# Patient Record
Sex: Female | Born: 1935
Health system: Southern US, Community
[De-identification: ages and names within clinical notes are randomized; demographics above are authoritative.]

## PROBLEM LIST (undated history)

## (undated) DIAGNOSIS — J449 Chronic obstructive pulmonary disease, unspecified: Secondary | ICD-10-CM

## (undated) DIAGNOSIS — F0390 Unspecified dementia without behavioral disturbance: Secondary | ICD-10-CM

## (undated) DIAGNOSIS — I10 Essential (primary) hypertension: Secondary | ICD-10-CM

---

## 2008-03-14 ENCOUNTER — Emergency Department (HOSPITAL_COMMUNITY): Admission: EM | Admit: 2008-03-14 | Discharge: 2008-03-14 | Payer: Self-pay | Admitting: Emergency Medicine

## 2009-09-06 ENCOUNTER — Inpatient Hospital Stay (HOSPITAL_COMMUNITY): Admission: RE | Admit: 2009-09-06 | Discharge: 2009-09-08 | Payer: Self-pay | Admitting: Orthopedic Surgery

## 2009-09-21 ENCOUNTER — Ambulatory Visit: Payer: Self-pay | Admitting: Vascular Surgery

## 2010-10-05 LAB — BASIC METABOLIC PANEL
BUN: 12 mg/dL (ref 6–23)
CO2: 28 mEq/L (ref 19–32)
CO2: 29 mEq/L (ref 19–32)
Calcium: 8.3 mg/dL — ABNORMAL LOW (ref 8.4–10.5)
Chloride: 108 mEq/L (ref 96–112)
Creatinine, Ser: 0.6 mg/dL (ref 0.4–1.2)
Creatinine, Ser: 0.74 mg/dL (ref 0.4–1.2)
Glucose, Bld: 103 mg/dL — ABNORMAL HIGH (ref 70–99)

## 2010-10-05 LAB — TYPE AND SCREEN: ABO/RH(D): A POS

## 2010-10-05 LAB — CBC
MCHC: 34.5 g/dL (ref 30.0–36.0)
MCHC: 34.7 g/dL (ref 30.0–36.0)
MCV: 93.5 fL (ref 78.0–100.0)
Platelets: 164 10*3/uL (ref 150–400)
RDW: 12.4 % (ref 11.5–15.5)
RDW: 12.9 % (ref 11.5–15.5)

## 2010-10-05 LAB — PROTIME-INR
INR: 1.28 (ref 0.00–1.49)
Prothrombin Time: 12.3 seconds (ref 11.6–15.2)
Prothrombin Time: 15.9 seconds — ABNORMAL HIGH (ref 11.6–15.2)

## 2010-10-05 LAB — URINALYSIS, ROUTINE W REFLEX MICROSCOPIC
Glucose, UA: NEGATIVE mg/dL
Ketones, ur: NEGATIVE mg/dL
pH: 6 (ref 5.0–8.0)

## 2010-10-05 LAB — ABO/RH: ABO/RH(D): A POS

## 2010-10-05 LAB — APTT: aPTT: 29 seconds (ref 24–37)

## 2010-10-05 LAB — GLUCOSE, CAPILLARY: Glucose-Capillary: 124 mg/dL — ABNORMAL HIGH (ref 70–99)

## 2010-11-28 NOTE — Procedures (Signed)
DUPLEX DEEP VENOUS EXAM - LOWER EXTREMITY   INDICATION:  Right lower extremity swelling and pain.   HISTORY:  Edema:  Yes, started 3 days prior.  Trauma/Surgery:  Two weeks post right hip replacement.  Pain:  Yes.  PE:  No.  Previous DVT:  No.  Anticoagulants:  No.  Other:   DUPLEX EXAM:                CFV   SFV   PopV  PTV    GSV                R  L  R  L  R  L  R   L  R  L  Thrombosis    o  o  o     o     o      o  Spontaneous   +  +  +     +     +      +  Phasic        +  +  +     +     +      +  Augmentation  +  +  +     +     +      +  Compressible  +  +  +     +     +      +  Competent     +  +  +     +     +      +   Legend:  + - yes  o - no  p - partial  D - decreased   IMPRESSION:  No evidence of deep venous thrombosis identified.  Popliteal is difficult to visualize due to swelling, but complete color  filling is noted.  Fluid retention is also noted in the calf.    _____________________________  Janetta Hora. Fields, MD   CJ/MEDQ  D:  09/21/2009  T:  09/21/2009  Job:  [id number]

## 2012-05-06 ENCOUNTER — Encounter: Payer: Self-pay | Admitting: Internal Medicine

## 2012-05-06 DIAGNOSIS — F0390 Unspecified dementia without behavioral disturbance: Secondary | ICD-10-CM

## 2012-05-06 DIAGNOSIS — Z8673 Personal history of transient ischemic attack (TIA), and cerebral infarction without residual deficits: Secondary | ICD-10-CM

## 2013-05-06 DIAGNOSIS — R7989 Other specified abnormal findings of blood chemistry: Secondary | ICD-10-CM

## 2013-05-06 DIAGNOSIS — R21 Rash and other nonspecific skin eruption: Secondary | ICD-10-CM

## 2015-07-30 DIAGNOSIS — K5900 Constipation, unspecified: Secondary | ICD-10-CM | POA: Diagnosis not present

## 2015-07-30 DIAGNOSIS — Z8719 Personal history of other diseases of the digestive system: Secondary | ICD-10-CM | POA: Diagnosis not present

## 2015-08-12 DIAGNOSIS — G309 Alzheimer's disease, unspecified: Secondary | ICD-10-CM | POA: Diagnosis not present

## 2015-08-12 DIAGNOSIS — Z789 Other specified health status: Secondary | ICD-10-CM | POA: Diagnosis not present

## 2015-08-12 DIAGNOSIS — I10 Essential (primary) hypertension: Secondary | ICD-10-CM | POA: Diagnosis not present

## 2015-08-12 DIAGNOSIS — Z6822 Body mass index (BMI) 22.0-22.9, adult: Secondary | ICD-10-CM | POA: Diagnosis not present

## 2015-08-25 DIAGNOSIS — M5481 Occipital neuralgia: Secondary | ICD-10-CM | POA: Diagnosis not present

## 2015-08-30 DIAGNOSIS — I7389 Other specified peripheral vascular diseases: Secondary | ICD-10-CM | POA: Diagnosis not present

## 2015-08-30 DIAGNOSIS — R51 Headache: Secondary | ICD-10-CM | POA: Diagnosis not present

## 2015-08-30 DIAGNOSIS — Z87898 Personal history of other specified conditions: Secondary | ICD-10-CM | POA: Diagnosis not present

## 2015-09-13 DIAGNOSIS — G309 Alzheimer's disease, unspecified: Secondary | ICD-10-CM | POA: Diagnosis not present

## 2015-09-13 DIAGNOSIS — E7800 Pure hypercholesterolemia, unspecified: Secondary | ICD-10-CM | POA: Diagnosis not present

## 2015-09-13 DIAGNOSIS — K219 Gastro-esophageal reflux disease without esophagitis: Secondary | ICD-10-CM | POA: Diagnosis not present

## 2015-11-09 DIAGNOSIS — J449 Chronic obstructive pulmonary disease, unspecified: Secondary | ICD-10-CM | POA: Diagnosis not present

## 2015-11-11 DIAGNOSIS — R5383 Other fatigue: Secondary | ICD-10-CM | POA: Diagnosis not present

## 2015-11-11 DIAGNOSIS — Z1389 Encounter for screening for other disorder: Secondary | ICD-10-CM | POA: Diagnosis not present

## 2015-11-11 DIAGNOSIS — Z299 Encounter for prophylactic measures, unspecified: Secondary | ICD-10-CM | POA: Diagnosis not present

## 2015-11-11 DIAGNOSIS — Z6823 Body mass index (BMI) 23.0-23.9, adult: Secondary | ICD-10-CM | POA: Diagnosis not present

## 2015-11-11 DIAGNOSIS — Z1211 Encounter for screening for malignant neoplasm of colon: Secondary | ICD-10-CM | POA: Diagnosis not present

## 2015-11-11 DIAGNOSIS — Z0000 Encounter for general adult medical examination without abnormal findings: Secondary | ICD-10-CM | POA: Diagnosis not present

## 2015-11-11 DIAGNOSIS — Z7189 Other specified counseling: Secondary | ICD-10-CM | POA: Diagnosis not present

## 2015-11-11 DIAGNOSIS — E559 Vitamin D deficiency, unspecified: Secondary | ICD-10-CM | POA: Diagnosis not present

## 2015-11-11 DIAGNOSIS — E7800 Pure hypercholesterolemia, unspecified: Secondary | ICD-10-CM | POA: Diagnosis not present

## 2015-11-25 DIAGNOSIS — E7800 Pure hypercholesterolemia, unspecified: Secondary | ICD-10-CM | POA: Diagnosis not present

## 2015-11-25 DIAGNOSIS — I10 Essential (primary) hypertension: Secondary | ICD-10-CM | POA: Diagnosis not present

## 2015-11-25 DIAGNOSIS — G309 Alzheimer's disease, unspecified: Secondary | ICD-10-CM | POA: Diagnosis not present

## 2015-12-02 DIAGNOSIS — L03116 Cellulitis of left lower limb: Secondary | ICD-10-CM | POA: Diagnosis not present

## 2015-12-09 DIAGNOSIS — Z299 Encounter for prophylactic measures, unspecified: Secondary | ICD-10-CM | POA: Diagnosis not present

## 2015-12-09 DIAGNOSIS — M7061 Trochanteric bursitis, right hip: Secondary | ICD-10-CM | POA: Diagnosis not present

## 2015-12-09 DIAGNOSIS — L03116 Cellulitis of left lower limb: Secondary | ICD-10-CM | POA: Diagnosis not present

## 2015-12-09 DIAGNOSIS — R609 Edema, unspecified: Secondary | ICD-10-CM | POA: Diagnosis not present

## 2016-01-09 DIAGNOSIS — L570 Actinic keratosis: Secondary | ICD-10-CM | POA: Diagnosis not present

## 2016-01-09 DIAGNOSIS — L219 Seborrheic dermatitis, unspecified: Secondary | ICD-10-CM | POA: Diagnosis not present

## 2016-01-12 DIAGNOSIS — H6123 Impacted cerumen, bilateral: Secondary | ICD-10-CM | POA: Diagnosis not present

## 2016-02-01 DIAGNOSIS — M2010 Hallux valgus (acquired), unspecified foot: Secondary | ICD-10-CM | POA: Diagnosis not present

## 2016-02-01 DIAGNOSIS — L600 Ingrowing nail: Secondary | ICD-10-CM | POA: Diagnosis not present

## 2016-02-01 DIAGNOSIS — M79673 Pain in unspecified foot: Secondary | ICD-10-CM | POA: Diagnosis not present

## 2016-02-01 DIAGNOSIS — L409 Psoriasis, unspecified: Secondary | ICD-10-CM | POA: Diagnosis not present

## 2016-02-01 DIAGNOSIS — B351 Tinea unguium: Secondary | ICD-10-CM | POA: Diagnosis not present

## 2016-02-07 DIAGNOSIS — I10 Essential (primary) hypertension: Secondary | ICD-10-CM | POA: Diagnosis not present

## 2016-02-07 DIAGNOSIS — G309 Alzheimer's disease, unspecified: Secondary | ICD-10-CM | POA: Diagnosis not present

## 2016-02-07 DIAGNOSIS — E7800 Pure hypercholesterolemia, unspecified: Secondary | ICD-10-CM | POA: Diagnosis not present

## 2016-02-29 DIAGNOSIS — J471 Bronchiectasis with (acute) exacerbation: Secondary | ICD-10-CM | POA: Diagnosis not present

## 2016-02-29 DIAGNOSIS — J441 Chronic obstructive pulmonary disease with (acute) exacerbation: Secondary | ICD-10-CM | POA: Diagnosis not present

## 2016-03-15 DIAGNOSIS — G309 Alzheimer's disease, unspecified: Secondary | ICD-10-CM | POA: Diagnosis not present

## 2016-03-15 DIAGNOSIS — E7800 Pure hypercholesterolemia, unspecified: Secondary | ICD-10-CM | POA: Diagnosis not present

## 2016-03-15 DIAGNOSIS — I10 Essential (primary) hypertension: Secondary | ICD-10-CM | POA: Diagnosis not present

## 2016-05-01 DIAGNOSIS — E7800 Pure hypercholesterolemia, unspecified: Secondary | ICD-10-CM | POA: Diagnosis not present

## 2016-05-01 DIAGNOSIS — G309 Alzheimer's disease, unspecified: Secondary | ICD-10-CM | POA: Diagnosis not present

## 2016-05-01 DIAGNOSIS — I10 Essential (primary) hypertension: Secondary | ICD-10-CM | POA: Diagnosis not present

## 2016-05-09 DIAGNOSIS — Z789 Other specified health status: Secondary | ICD-10-CM | POA: Diagnosis not present

## 2016-05-09 DIAGNOSIS — Z23 Encounter for immunization: Secondary | ICD-10-CM | POA: Diagnosis not present

## 2016-05-14 DIAGNOSIS — Z299 Encounter for prophylactic measures, unspecified: Secondary | ICD-10-CM | POA: Diagnosis not present

## 2016-05-14 DIAGNOSIS — E559 Vitamin D deficiency, unspecified: Secondary | ICD-10-CM | POA: Diagnosis not present

## 2016-05-14 DIAGNOSIS — E7800 Pure hypercholesterolemia, unspecified: Secondary | ICD-10-CM | POA: Diagnosis not present

## 2016-05-14 DIAGNOSIS — I739 Peripheral vascular disease, unspecified: Secondary | ICD-10-CM | POA: Diagnosis not present

## 2016-05-14 DIAGNOSIS — I10 Essential (primary) hypertension: Secondary | ICD-10-CM | POA: Diagnosis not present

## 2016-05-14 DIAGNOSIS — Z6824 Body mass index (BMI) 24.0-24.9, adult: Secondary | ICD-10-CM | POA: Diagnosis not present

## 2016-07-05 DIAGNOSIS — J9610 Chronic respiratory failure, unspecified whether with hypoxia or hypercapnia: Secondary | ICD-10-CM | POA: Diagnosis not present

## 2016-07-05 DIAGNOSIS — J449 Chronic obstructive pulmonary disease, unspecified: Secondary | ICD-10-CM | POA: Diagnosis not present

## 2016-07-25 DIAGNOSIS — G309 Alzheimer's disease, unspecified: Secondary | ICD-10-CM | POA: Diagnosis not present

## 2016-07-25 DIAGNOSIS — E7800 Pure hypercholesterolemia, unspecified: Secondary | ICD-10-CM | POA: Diagnosis not present

## 2016-07-25 DIAGNOSIS — I10 Essential (primary) hypertension: Secondary | ICD-10-CM | POA: Diagnosis not present

## 2016-08-20 DIAGNOSIS — E7800 Pure hypercholesterolemia, unspecified: Secondary | ICD-10-CM | POA: Diagnosis not present

## 2016-08-20 DIAGNOSIS — E559 Vitamin D deficiency, unspecified: Secondary | ICD-10-CM | POA: Diagnosis not present

## 2016-08-20 DIAGNOSIS — Z713 Dietary counseling and surveillance: Secondary | ICD-10-CM | POA: Diagnosis not present

## 2016-08-20 DIAGNOSIS — Z789 Other specified health status: Secondary | ICD-10-CM | POA: Diagnosis not present

## 2016-08-20 DIAGNOSIS — I739 Peripheral vascular disease, unspecified: Secondary | ICD-10-CM | POA: Diagnosis not present

## 2016-08-20 DIAGNOSIS — J449 Chronic obstructive pulmonary disease, unspecified: Secondary | ICD-10-CM | POA: Diagnosis not present

## 2016-08-20 DIAGNOSIS — Z6824 Body mass index (BMI) 24.0-24.9, adult: Secondary | ICD-10-CM | POA: Diagnosis not present

## 2016-08-20 DIAGNOSIS — F0280 Dementia in other diseases classified elsewhere without behavioral disturbance: Secondary | ICD-10-CM | POA: Diagnosis not present

## 2016-08-20 DIAGNOSIS — Z299 Encounter for prophylactic measures, unspecified: Secondary | ICD-10-CM | POA: Diagnosis not present

## 2016-08-20 DIAGNOSIS — G309 Alzheimer's disease, unspecified: Secondary | ICD-10-CM | POA: Diagnosis not present

## 2016-11-07 DIAGNOSIS — J00 Acute nasopharyngitis [common cold]: Secondary | ICD-10-CM | POA: Diagnosis not present

## 2016-11-07 DIAGNOSIS — H6123 Impacted cerumen, bilateral: Secondary | ICD-10-CM | POA: Diagnosis not present

## 2016-11-08 DIAGNOSIS — E7800 Pure hypercholesterolemia, unspecified: Secondary | ICD-10-CM | POA: Diagnosis not present

## 2016-11-08 DIAGNOSIS — I10 Essential (primary) hypertension: Secondary | ICD-10-CM | POA: Diagnosis not present

## 2016-11-08 DIAGNOSIS — G309 Alzheimer's disease, unspecified: Secondary | ICD-10-CM | POA: Diagnosis not present

## 2016-11-22 DIAGNOSIS — G309 Alzheimer's disease, unspecified: Secondary | ICD-10-CM | POA: Diagnosis not present

## 2016-11-22 DIAGNOSIS — J449 Chronic obstructive pulmonary disease, unspecified: Secondary | ICD-10-CM | POA: Diagnosis not present

## 2016-11-22 DIAGNOSIS — Z713 Dietary counseling and surveillance: Secondary | ICD-10-CM | POA: Diagnosis not present

## 2016-11-22 DIAGNOSIS — Z6825 Body mass index (BMI) 25.0-25.9, adult: Secondary | ICD-10-CM | POA: Diagnosis not present

## 2016-11-22 DIAGNOSIS — Z299 Encounter for prophylactic measures, unspecified: Secondary | ICD-10-CM | POA: Diagnosis not present

## 2016-11-22 DIAGNOSIS — B079 Viral wart, unspecified: Secondary | ICD-10-CM | POA: Diagnosis not present

## 2016-11-22 DIAGNOSIS — E7800 Pure hypercholesterolemia, unspecified: Secondary | ICD-10-CM | POA: Diagnosis not present

## 2016-12-25 DIAGNOSIS — Z0000 Encounter for general adult medical examination without abnormal findings: Secondary | ICD-10-CM | POA: Diagnosis not present

## 2016-12-25 DIAGNOSIS — Z299 Encounter for prophylactic measures, unspecified: Secondary | ICD-10-CM | POA: Diagnosis not present

## 2016-12-25 DIAGNOSIS — Z6825 Body mass index (BMI) 25.0-25.9, adult: Secondary | ICD-10-CM | POA: Diagnosis not present

## 2016-12-25 DIAGNOSIS — Z1389 Encounter for screening for other disorder: Secondary | ICD-10-CM | POA: Diagnosis not present

## 2016-12-25 DIAGNOSIS — Z1211 Encounter for screening for malignant neoplasm of colon: Secondary | ICD-10-CM | POA: Diagnosis not present

## 2016-12-25 DIAGNOSIS — Z7189 Other specified counseling: Secondary | ICD-10-CM | POA: Diagnosis not present

## 2016-12-28 DIAGNOSIS — E559 Vitamin D deficiency, unspecified: Secondary | ICD-10-CM | POA: Diagnosis not present

## 2016-12-28 DIAGNOSIS — E7800 Pure hypercholesterolemia, unspecified: Secondary | ICD-10-CM | POA: Diagnosis not present

## 2016-12-28 DIAGNOSIS — R5383 Other fatigue: Secondary | ICD-10-CM | POA: Diagnosis not present

## 2016-12-28 DIAGNOSIS — Z79899 Other long term (current) drug therapy: Secondary | ICD-10-CM | POA: Diagnosis not present

## 2017-01-09 DIAGNOSIS — J3089 Other allergic rhinitis: Secondary | ICD-10-CM | POA: Diagnosis not present

## 2017-01-09 DIAGNOSIS — J449 Chronic obstructive pulmonary disease, unspecified: Secondary | ICD-10-CM | POA: Diagnosis not present

## 2017-02-12 DIAGNOSIS — E2839 Other primary ovarian failure: Secondary | ICD-10-CM | POA: Diagnosis not present

## 2017-05-02 DIAGNOSIS — L409 Psoriasis, unspecified: Secondary | ICD-10-CM | POA: Diagnosis not present

## 2017-05-02 DIAGNOSIS — L570 Actinic keratosis: Secondary | ICD-10-CM | POA: Diagnosis not present

## 2017-05-09 DIAGNOSIS — Z23 Encounter for immunization: Secondary | ICD-10-CM | POA: Diagnosis not present

## 2017-05-16 DEATH — deceased

## 2017-06-18 DIAGNOSIS — I2781 Cor pulmonale (chronic): Secondary | ICD-10-CM | POA: Diagnosis not present

## 2017-06-18 DIAGNOSIS — J449 Chronic obstructive pulmonary disease, unspecified: Secondary | ICD-10-CM | POA: Diagnosis not present

## 2017-06-18 DIAGNOSIS — J9610 Chronic respiratory failure, unspecified whether with hypoxia or hypercapnia: Secondary | ICD-10-CM | POA: Diagnosis not present

## 2017-06-27 DIAGNOSIS — I10 Essential (primary) hypertension: Secondary | ICD-10-CM | POA: Diagnosis not present

## 2017-06-27 DIAGNOSIS — F0280 Dementia in other diseases classified elsewhere without behavioral disturbance: Secondary | ICD-10-CM | POA: Diagnosis not present

## 2017-06-27 DIAGNOSIS — J449 Chronic obstructive pulmonary disease, unspecified: Secondary | ICD-10-CM | POA: Diagnosis not present

## 2017-06-27 DIAGNOSIS — E7800 Pure hypercholesterolemia, unspecified: Secondary | ICD-10-CM | POA: Diagnosis not present

## 2017-06-27 DIAGNOSIS — Z6825 Body mass index (BMI) 25.0-25.9, adult: Secondary | ICD-10-CM | POA: Diagnosis not present

## 2017-06-27 DIAGNOSIS — G309 Alzheimer's disease, unspecified: Secondary | ICD-10-CM | POA: Diagnosis not present

## 2017-06-27 DIAGNOSIS — Z299 Encounter for prophylactic measures, unspecified: Secondary | ICD-10-CM | POA: Diagnosis not present

## 2017-06-27 DIAGNOSIS — Z789 Other specified health status: Secondary | ICD-10-CM | POA: Diagnosis not present

## 2017-07-12 DIAGNOSIS — R9082 White matter disease, unspecified: Secondary | ICD-10-CM | POA: Diagnosis not present

## 2017-07-12 DIAGNOSIS — I739 Peripheral vascular disease, unspecified: Secondary | ICD-10-CM | POA: Diagnosis not present

## 2017-07-12 DIAGNOSIS — I10 Essential (primary) hypertension: Secondary | ICD-10-CM | POA: Diagnosis not present

## 2017-07-12 DIAGNOSIS — R531 Weakness: Secondary | ICD-10-CM | POA: Diagnosis not present

## 2017-07-12 DIAGNOSIS — Z6825 Body mass index (BMI) 25.0-25.9, adult: Secondary | ICD-10-CM | POA: Diagnosis not present

## 2017-07-12 DIAGNOSIS — Z299 Encounter for prophylactic measures, unspecified: Secondary | ICD-10-CM | POA: Diagnosis not present

## 2017-07-12 DIAGNOSIS — G309 Alzheimer's disease, unspecified: Secondary | ICD-10-CM | POA: Diagnosis not present

## 2017-07-12 DIAGNOSIS — I672 Cerebral atherosclerosis: Secondary | ICD-10-CM | POA: Diagnosis not present

## 2017-07-12 DIAGNOSIS — I6789 Other cerebrovascular disease: Secondary | ICD-10-CM | POA: Diagnosis not present

## 2017-07-12 DIAGNOSIS — Z79899 Other long term (current) drug therapy: Secondary | ICD-10-CM | POA: Diagnosis not present

## 2017-07-12 DIAGNOSIS — G319 Degenerative disease of nervous system, unspecified: Secondary | ICD-10-CM | POA: Diagnosis not present

## 2017-07-12 DIAGNOSIS — M6281 Muscle weakness (generalized): Secondary | ICD-10-CM | POA: Diagnosis not present

## 2017-07-12 DIAGNOSIS — R5383 Other fatigue: Secondary | ICD-10-CM | POA: Diagnosis not present

## 2017-07-12 DIAGNOSIS — F0280 Dementia in other diseases classified elsewhere without behavioral disturbance: Secondary | ICD-10-CM | POA: Diagnosis not present

## 2017-07-12 DIAGNOSIS — J449 Chronic obstructive pulmonary disease, unspecified: Secondary | ICD-10-CM | POA: Diagnosis not present

## 2017-07-12 DIAGNOSIS — Z8673 Personal history of transient ischemic attack (TIA), and cerebral infarction without residual deficits: Secondary | ICD-10-CM | POA: Diagnosis not present

## 2017-07-18 DIAGNOSIS — Z6825 Body mass index (BMI) 25.0-25.9, adult: Secondary | ICD-10-CM | POA: Diagnosis not present

## 2017-07-18 DIAGNOSIS — Z789 Other specified health status: Secondary | ICD-10-CM | POA: Diagnosis not present

## 2017-07-18 DIAGNOSIS — R5383 Other fatigue: Secondary | ICD-10-CM | POA: Diagnosis not present

## 2017-07-18 DIAGNOSIS — F0280 Dementia in other diseases classified elsewhere without behavioral disturbance: Secondary | ICD-10-CM | POA: Diagnosis not present

## 2017-07-18 DIAGNOSIS — J449 Chronic obstructive pulmonary disease, unspecified: Secondary | ICD-10-CM | POA: Diagnosis not present

## 2017-07-18 DIAGNOSIS — Z299 Encounter for prophylactic measures, unspecified: Secondary | ICD-10-CM | POA: Diagnosis not present

## 2017-07-18 DIAGNOSIS — G309 Alzheimer's disease, unspecified: Secondary | ICD-10-CM | POA: Diagnosis not present

## 2017-08-21 DIAGNOSIS — I10 Essential (primary) hypertension: Secondary | ICD-10-CM | POA: Diagnosis not present

## 2017-08-21 DIAGNOSIS — Z789 Other specified health status: Secondary | ICD-10-CM | POA: Diagnosis not present

## 2017-08-21 DIAGNOSIS — R102 Pelvic and perineal pain: Secondary | ICD-10-CM | POA: Diagnosis not present

## 2017-08-21 DIAGNOSIS — J449 Chronic obstructive pulmonary disease, unspecified: Secondary | ICD-10-CM | POA: Diagnosis not present

## 2017-08-21 DIAGNOSIS — Z299 Encounter for prophylactic measures, unspecified: Secondary | ICD-10-CM | POA: Diagnosis not present

## 2017-08-21 DIAGNOSIS — N390 Urinary tract infection, site not specified: Secondary | ICD-10-CM | POA: Diagnosis not present

## 2017-08-21 DIAGNOSIS — I739 Peripheral vascular disease, unspecified: Secondary | ICD-10-CM | POA: Diagnosis not present

## 2017-08-21 DIAGNOSIS — Z6825 Body mass index (BMI) 25.0-25.9, adult: Secondary | ICD-10-CM | POA: Diagnosis not present

## 2017-08-21 DIAGNOSIS — F0280 Dementia in other diseases classified elsewhere without behavioral disturbance: Secondary | ICD-10-CM | POA: Diagnosis not present

## 2017-08-21 DIAGNOSIS — G309 Alzheimer's disease, unspecified: Secondary | ICD-10-CM | POA: Diagnosis not present

## 2017-09-30 DIAGNOSIS — J449 Chronic obstructive pulmonary disease, unspecified: Secondary | ICD-10-CM | POA: Diagnosis not present

## 2017-09-30 DIAGNOSIS — Z299 Encounter for prophylactic measures, unspecified: Secondary | ICD-10-CM | POA: Diagnosis not present

## 2017-09-30 DIAGNOSIS — I10 Essential (primary) hypertension: Secondary | ICD-10-CM | POA: Diagnosis not present

## 2017-09-30 DIAGNOSIS — G309 Alzheimer's disease, unspecified: Secondary | ICD-10-CM | POA: Diagnosis not present

## 2017-09-30 DIAGNOSIS — Z6826 Body mass index (BMI) 26.0-26.9, adult: Secondary | ICD-10-CM | POA: Diagnosis not present

## 2017-09-30 DIAGNOSIS — F0280 Dementia in other diseases classified elsewhere without behavioral disturbance: Secondary | ICD-10-CM | POA: Diagnosis not present

## 2017-09-30 DIAGNOSIS — L03119 Cellulitis of unspecified part of limb: Secondary | ICD-10-CM | POA: Diagnosis not present

## 2017-10-08 DIAGNOSIS — J449 Chronic obstructive pulmonary disease, unspecified: Secondary | ICD-10-CM | POA: Diagnosis not present

## 2017-10-08 DIAGNOSIS — R350 Frequency of micturition: Secondary | ICD-10-CM | POA: Diagnosis not present

## 2017-10-08 DIAGNOSIS — N390 Urinary tract infection, site not specified: Secondary | ICD-10-CM | POA: Diagnosis not present

## 2017-10-08 DIAGNOSIS — Z299 Encounter for prophylactic measures, unspecified: Secondary | ICD-10-CM | POA: Diagnosis not present

## 2017-10-08 DIAGNOSIS — I10 Essential (primary) hypertension: Secondary | ICD-10-CM | POA: Diagnosis not present

## 2017-10-08 DIAGNOSIS — Z6825 Body mass index (BMI) 25.0-25.9, adult: Secondary | ICD-10-CM | POA: Diagnosis not present

## 2017-10-08 DIAGNOSIS — R51 Headache: Secondary | ICD-10-CM | POA: Diagnosis not present

## 2017-10-08 DIAGNOSIS — I739 Peripheral vascular disease, unspecified: Secondary | ICD-10-CM | POA: Diagnosis not present

## 2017-11-15 DIAGNOSIS — H6123 Impacted cerumen, bilateral: Secondary | ICD-10-CM | POA: Diagnosis not present

## 2017-12-10 DIAGNOSIS — J449 Chronic obstructive pulmonary disease, unspecified: Secondary | ICD-10-CM | POA: Diagnosis not present

## 2017-12-10 DIAGNOSIS — Z79899 Other long term (current) drug therapy: Secondary | ICD-10-CM | POA: Diagnosis not present

## 2017-12-10 DIAGNOSIS — Z7902 Long term (current) use of antithrombotics/antiplatelets: Secondary | ICD-10-CM | POA: Diagnosis not present

## 2017-12-10 DIAGNOSIS — R51 Headache: Secondary | ICD-10-CM | POA: Diagnosis not present

## 2017-12-10 DIAGNOSIS — Z8673 Personal history of transient ischemic attack (TIA), and cerebral infarction without residual deficits: Secondary | ICD-10-CM | POA: Diagnosis not present

## 2017-12-10 DIAGNOSIS — Z7951 Long term (current) use of inhaled steroids: Secondary | ICD-10-CM | POA: Diagnosis not present

## 2017-12-10 DIAGNOSIS — F0390 Unspecified dementia without behavioral disturbance: Secondary | ICD-10-CM | POA: Diagnosis not present

## 2017-12-10 DIAGNOSIS — I10 Essential (primary) hypertension: Secondary | ICD-10-CM | POA: Diagnosis not present

## 2017-12-12 DIAGNOSIS — R030 Elevated blood-pressure reading, without diagnosis of hypertension: Secondary | ICD-10-CM | POA: Diagnosis not present

## 2017-12-12 DIAGNOSIS — F0280 Dementia in other diseases classified elsewhere without behavioral disturbance: Secondary | ICD-10-CM | POA: Diagnosis not present

## 2017-12-12 DIAGNOSIS — Z6825 Body mass index (BMI) 25.0-25.9, adult: Secondary | ICD-10-CM | POA: Diagnosis not present

## 2017-12-12 DIAGNOSIS — I10 Essential (primary) hypertension: Secondary | ICD-10-CM | POA: Diagnosis not present

## 2017-12-12 DIAGNOSIS — Z299 Encounter for prophylactic measures, unspecified: Secondary | ICD-10-CM | POA: Diagnosis not present

## 2017-12-12 DIAGNOSIS — G309 Alzheimer's disease, unspecified: Secondary | ICD-10-CM | POA: Diagnosis not present

## 2017-12-30 DIAGNOSIS — E559 Vitamin D deficiency, unspecified: Secondary | ICD-10-CM | POA: Diagnosis not present

## 2017-12-30 DIAGNOSIS — Z7189 Other specified counseling: Secondary | ICD-10-CM | POA: Diagnosis not present

## 2017-12-30 DIAGNOSIS — E7800 Pure hypercholesterolemia, unspecified: Secondary | ICD-10-CM | POA: Diagnosis not present

## 2017-12-30 DIAGNOSIS — Z79899 Other long term (current) drug therapy: Secondary | ICD-10-CM | POA: Diagnosis not present

## 2017-12-30 DIAGNOSIS — W57XXXA Bitten or stung by nonvenomous insect and other nonvenomous arthropods, initial encounter: Secondary | ICD-10-CM | POA: Diagnosis not present

## 2017-12-30 DIAGNOSIS — Z1211 Encounter for screening for malignant neoplasm of colon: Secondary | ICD-10-CM | POA: Diagnosis not present

## 2017-12-30 DIAGNOSIS — Z1331 Encounter for screening for depression: Secondary | ICD-10-CM | POA: Diagnosis not present

## 2017-12-30 DIAGNOSIS — Z299 Encounter for prophylactic measures, unspecified: Secondary | ICD-10-CM | POA: Diagnosis not present

## 2017-12-30 DIAGNOSIS — F0280 Dementia in other diseases classified elsewhere without behavioral disturbance: Secondary | ICD-10-CM | POA: Diagnosis not present

## 2017-12-30 DIAGNOSIS — Z6826 Body mass index (BMI) 26.0-26.9, adult: Secondary | ICD-10-CM | POA: Diagnosis not present

## 2017-12-30 DIAGNOSIS — R5383 Other fatigue: Secondary | ICD-10-CM | POA: Diagnosis not present

## 2017-12-30 DIAGNOSIS — Z0000 Encounter for general adult medical examination without abnormal findings: Secondary | ICD-10-CM | POA: Diagnosis not present

## 2017-12-30 DIAGNOSIS — Z1339 Encounter for screening examination for other mental health and behavioral disorders: Secondary | ICD-10-CM | POA: Diagnosis not present

## 2018-01-07 DIAGNOSIS — J411 Mucopurulent chronic bronchitis: Secondary | ICD-10-CM | POA: Diagnosis not present

## 2018-01-07 DIAGNOSIS — M1712 Unilateral primary osteoarthritis, left knee: Secondary | ICD-10-CM | POA: Diagnosis not present

## 2018-01-11 DIAGNOSIS — R011 Cardiac murmur, unspecified: Secondary | ICD-10-CM | POA: Diagnosis not present

## 2018-01-11 DIAGNOSIS — Z9181 History of falling: Secondary | ICD-10-CM | POA: Diagnosis not present

## 2018-01-11 DIAGNOSIS — J441 Chronic obstructive pulmonary disease with (acute) exacerbation: Secondary | ICD-10-CM | POA: Diagnosis present

## 2018-01-11 DIAGNOSIS — Z96649 Presence of unspecified artificial hip joint: Secondary | ICD-10-CM | POA: Diagnosis present

## 2018-01-11 DIAGNOSIS — M8088XA Other osteoporosis with current pathological fracture, vertebra(e), initial encounter for fracture: Secondary | ICD-10-CM | POA: Diagnosis not present

## 2018-01-11 DIAGNOSIS — W1830XA Fall on same level, unspecified, initial encounter: Secondary | ICD-10-CM | POA: Diagnosis not present

## 2018-01-11 DIAGNOSIS — R52 Pain, unspecified: Secondary | ICD-10-CM | POA: Diagnosis not present

## 2018-01-11 DIAGNOSIS — S32008G Other fracture of unspecified lumbar vertebra, subsequent encounter for fracture with delayed healing: Secondary | ICD-10-CM | POA: Diagnosis not present

## 2018-01-11 DIAGNOSIS — J9621 Acute and chronic respiratory failure with hypoxia: Secondary | ICD-10-CM | POA: Diagnosis present

## 2018-01-11 DIAGNOSIS — J45901 Unspecified asthma with (acute) exacerbation: Secondary | ICD-10-CM | POA: Diagnosis present

## 2018-01-11 DIAGNOSIS — Z9849 Cataract extraction status, unspecified eye: Secondary | ICD-10-CM | POA: Diagnosis not present

## 2018-01-11 DIAGNOSIS — Z90710 Acquired absence of both cervix and uterus: Secondary | ICD-10-CM | POA: Diagnosis not present

## 2018-01-11 DIAGNOSIS — R112 Nausea with vomiting, unspecified: Secondary | ICD-10-CM | POA: Diagnosis not present

## 2018-01-11 DIAGNOSIS — W19XXXA Unspecified fall, initial encounter: Secondary | ICD-10-CM | POA: Diagnosis not present

## 2018-01-11 DIAGNOSIS — R55 Syncope and collapse: Secondary | ICD-10-CM | POA: Diagnosis not present

## 2018-01-11 DIAGNOSIS — R2689 Other abnormalities of gait and mobility: Secondary | ICD-10-CM | POA: Diagnosis present

## 2018-01-11 DIAGNOSIS — S32029A Unspecified fracture of second lumbar vertebra, initial encounter for closed fracture: Secondary | ICD-10-CM | POA: Diagnosis present

## 2018-01-11 DIAGNOSIS — Z881 Allergy status to other antibiotic agents status: Secondary | ICD-10-CM | POA: Diagnosis not present

## 2018-01-11 DIAGNOSIS — F0280 Dementia in other diseases classified elsewhere without behavioral disturbance: Secondary | ICD-10-CM | POA: Diagnosis present

## 2018-01-11 DIAGNOSIS — Z87891 Personal history of nicotine dependence: Secondary | ICD-10-CM | POA: Diagnosis not present

## 2018-01-11 DIAGNOSIS — S32028A Other fracture of second lumbar vertebra, initial encounter for closed fracture: Secondary | ICD-10-CM | POA: Diagnosis not present

## 2018-01-11 DIAGNOSIS — G309 Alzheimer's disease, unspecified: Secondary | ICD-10-CM | POA: Diagnosis present

## 2018-01-11 DIAGNOSIS — W133XXA Fall through floor, initial encounter: Secondary | ICD-10-CM | POA: Diagnosis not present

## 2018-01-11 DIAGNOSIS — Y92091 Bathroom in other non-institutional residence as the place of occurrence of the external cause: Secondary | ICD-10-CM | POA: Diagnosis not present

## 2018-01-11 DIAGNOSIS — Z8673 Personal history of transient ischemic attack (TIA), and cerebral infarction without residual deficits: Secondary | ICD-10-CM | POA: Diagnosis not present

## 2018-01-11 DIAGNOSIS — Z885 Allergy status to narcotic agent status: Secondary | ICD-10-CM | POA: Diagnosis not present

## 2018-01-11 DIAGNOSIS — M5489 Other dorsalgia: Secondary | ICD-10-CM | POA: Diagnosis not present

## 2018-01-11 DIAGNOSIS — N390 Urinary tract infection, site not specified: Secondary | ICD-10-CM | POA: Diagnosis present

## 2018-01-11 DIAGNOSIS — Z9049 Acquired absence of other specified parts of digestive tract: Secondary | ICD-10-CM | POA: Diagnosis not present

## 2018-01-11 DIAGNOSIS — I10 Essential (primary) hypertension: Secondary | ICD-10-CM | POA: Diagnosis present

## 2018-01-18 DIAGNOSIS — S32020D Wedge compression fracture of second lumbar vertebra, subsequent encounter for fracture with routine healing: Secondary | ICD-10-CM | POA: Diagnosis not present

## 2018-01-18 DIAGNOSIS — N390 Urinary tract infection, site not specified: Secondary | ICD-10-CM | POA: Diagnosis not present

## 2018-01-20 DIAGNOSIS — N390 Urinary tract infection, site not specified: Secondary | ICD-10-CM | POA: Diagnosis not present

## 2018-01-20 DIAGNOSIS — S32020D Wedge compression fracture of second lumbar vertebra, subsequent encounter for fracture with routine healing: Secondary | ICD-10-CM | POA: Diagnosis not present

## 2018-01-21 DIAGNOSIS — I739 Peripheral vascular disease, unspecified: Secondary | ICD-10-CM | POA: Diagnosis not present

## 2018-01-21 DIAGNOSIS — Z6826 Body mass index (BMI) 26.0-26.9, adult: Secondary | ICD-10-CM | POA: Diagnosis not present

## 2018-01-21 DIAGNOSIS — J449 Chronic obstructive pulmonary disease, unspecified: Secondary | ICD-10-CM | POA: Diagnosis not present

## 2018-01-21 DIAGNOSIS — S32020D Wedge compression fracture of second lumbar vertebra, subsequent encounter for fracture with routine healing: Secondary | ICD-10-CM | POA: Diagnosis not present

## 2018-01-21 DIAGNOSIS — I10 Essential (primary) hypertension: Secondary | ICD-10-CM | POA: Diagnosis not present

## 2018-01-21 DIAGNOSIS — S32020A Wedge compression fracture of second lumbar vertebra, initial encounter for closed fracture: Secondary | ICD-10-CM | POA: Diagnosis not present

## 2018-01-21 DIAGNOSIS — Z299 Encounter for prophylactic measures, unspecified: Secondary | ICD-10-CM | POA: Diagnosis not present

## 2018-01-21 DIAGNOSIS — N390 Urinary tract infection, site not specified: Secondary | ICD-10-CM | POA: Diagnosis not present

## 2018-01-21 DIAGNOSIS — M549 Dorsalgia, unspecified: Secondary | ICD-10-CM | POA: Diagnosis not present

## 2018-01-22 DIAGNOSIS — M8088XA Other osteoporosis with current pathological fracture, vertebra(e), initial encounter for fracture: Secondary | ICD-10-CM | POA: Diagnosis not present

## 2018-01-22 DIAGNOSIS — S32029A Unspecified fracture of second lumbar vertebra, initial encounter for closed fracture: Secondary | ICD-10-CM | POA: Diagnosis not present

## 2018-01-22 DIAGNOSIS — Z01812 Encounter for preprocedural laboratory examination: Secondary | ICD-10-CM | POA: Diagnosis not present

## 2018-01-24 DIAGNOSIS — S32020D Wedge compression fracture of second lumbar vertebra, subsequent encounter for fracture with routine healing: Secondary | ICD-10-CM | POA: Diagnosis not present

## 2018-01-24 DIAGNOSIS — N390 Urinary tract infection, site not specified: Secondary | ICD-10-CM | POA: Diagnosis not present

## 2018-01-28 DIAGNOSIS — N390 Urinary tract infection, site not specified: Secondary | ICD-10-CM | POA: Diagnosis not present

## 2018-01-28 DIAGNOSIS — S32030D Wedge compression fracture of third lumbar vertebra, subsequent encounter for fracture with routine healing: Secondary | ICD-10-CM | POA: Diagnosis not present

## 2018-01-28 DIAGNOSIS — S32020D Wedge compression fracture of second lumbar vertebra, subsequent encounter for fracture with routine healing: Secondary | ICD-10-CM | POA: Diagnosis not present

## 2018-01-28 DIAGNOSIS — M8088XA Other osteoporosis with current pathological fracture, vertebra(e), initial encounter for fracture: Secondary | ICD-10-CM | POA: Diagnosis not present

## 2018-01-29 DIAGNOSIS — N390 Urinary tract infection, site not specified: Secondary | ICD-10-CM | POA: Diagnosis not present

## 2018-01-29 DIAGNOSIS — S32020D Wedge compression fracture of second lumbar vertebra, subsequent encounter for fracture with routine healing: Secondary | ICD-10-CM | POA: Diagnosis not present

## 2018-01-31 DIAGNOSIS — N390 Urinary tract infection, site not specified: Secondary | ICD-10-CM | POA: Diagnosis not present

## 2018-01-31 DIAGNOSIS — S32020D Wedge compression fracture of second lumbar vertebra, subsequent encounter for fracture with routine healing: Secondary | ICD-10-CM | POA: Diagnosis not present

## 2018-02-04 DIAGNOSIS — N390 Urinary tract infection, site not specified: Secondary | ICD-10-CM | POA: Diagnosis not present

## 2018-02-04 DIAGNOSIS — S32020D Wedge compression fracture of second lumbar vertebra, subsequent encounter for fracture with routine healing: Secondary | ICD-10-CM | POA: Diagnosis not present

## 2018-02-05 DIAGNOSIS — J411 Mucopurulent chronic bronchitis: Secondary | ICD-10-CM | POA: Diagnosis not present

## 2018-02-05 DIAGNOSIS — J9610 Chronic respiratory failure, unspecified whether with hypoxia or hypercapnia: Secondary | ICD-10-CM | POA: Diagnosis not present

## 2018-02-05 DIAGNOSIS — Z9889 Other specified postprocedural states: Secondary | ICD-10-CM | POA: Diagnosis not present

## 2018-02-05 DIAGNOSIS — Z8781 Personal history of (healed) traumatic fracture: Secondary | ICD-10-CM | POA: Diagnosis not present

## 2018-02-05 DIAGNOSIS — R0602 Shortness of breath: Secondary | ICD-10-CM | POA: Diagnosis not present

## 2018-02-05 DIAGNOSIS — J3089 Other allergic rhinitis: Secondary | ICD-10-CM | POA: Diagnosis not present

## 2018-02-07 DIAGNOSIS — S32020D Wedge compression fracture of second lumbar vertebra, subsequent encounter for fracture with routine healing: Secondary | ICD-10-CM | POA: Diagnosis not present

## 2018-02-07 DIAGNOSIS — N390 Urinary tract infection, site not specified: Secondary | ICD-10-CM | POA: Diagnosis not present

## 2018-02-11 DIAGNOSIS — G309 Alzheimer's disease, unspecified: Secondary | ICD-10-CM | POA: Diagnosis not present

## 2018-02-11 DIAGNOSIS — S32020D Wedge compression fracture of second lumbar vertebra, subsequent encounter for fracture with routine healing: Secondary | ICD-10-CM | POA: Diagnosis not present

## 2018-02-11 DIAGNOSIS — F0280 Dementia in other diseases classified elsewhere without behavioral disturbance: Secondary | ICD-10-CM | POA: Diagnosis not present

## 2018-02-11 DIAGNOSIS — Z6825 Body mass index (BMI) 25.0-25.9, adult: Secondary | ICD-10-CM | POA: Diagnosis not present

## 2018-02-11 DIAGNOSIS — Z299 Encounter for prophylactic measures, unspecified: Secondary | ICD-10-CM | POA: Diagnosis not present

## 2018-02-11 DIAGNOSIS — R5383 Other fatigue: Secondary | ICD-10-CM | POA: Diagnosis not present

## 2018-02-11 DIAGNOSIS — N390 Urinary tract infection, site not specified: Secondary | ICD-10-CM | POA: Diagnosis not present

## 2018-02-11 DIAGNOSIS — R350 Frequency of micturition: Secondary | ICD-10-CM | POA: Diagnosis not present

## 2018-02-13 DIAGNOSIS — N390 Urinary tract infection, site not specified: Secondary | ICD-10-CM | POA: Diagnosis not present

## 2018-02-13 DIAGNOSIS — S32020D Wedge compression fracture of second lumbar vertebra, subsequent encounter for fracture with routine healing: Secondary | ICD-10-CM | POA: Diagnosis not present

## 2018-02-18 DIAGNOSIS — N390 Urinary tract infection, site not specified: Secondary | ICD-10-CM | POA: Diagnosis not present

## 2018-02-18 DIAGNOSIS — S32020D Wedge compression fracture of second lumbar vertebra, subsequent encounter for fracture with routine healing: Secondary | ICD-10-CM | POA: Diagnosis not present

## 2018-02-20 DIAGNOSIS — S32020D Wedge compression fracture of second lumbar vertebra, subsequent encounter for fracture with routine healing: Secondary | ICD-10-CM | POA: Diagnosis not present

## 2018-02-20 DIAGNOSIS — N390 Urinary tract infection, site not specified: Secondary | ICD-10-CM | POA: Diagnosis not present

## 2018-04-09 DIAGNOSIS — S32029D Unspecified fracture of second lumbar vertebra, subsequent encounter for fracture with routine healing: Secondary | ICD-10-CM | POA: Diagnosis not present

## 2018-04-14 DIAGNOSIS — L570 Actinic keratosis: Secondary | ICD-10-CM | POA: Diagnosis not present

## 2018-04-14 DIAGNOSIS — L219 Seborrheic dermatitis, unspecified: Secondary | ICD-10-CM | POA: Diagnosis not present

## 2018-04-14 DIAGNOSIS — L821 Other seborrheic keratosis: Secondary | ICD-10-CM | POA: Diagnosis not present

## 2018-05-19 DIAGNOSIS — Z23 Encounter for immunization: Secondary | ICD-10-CM | POA: Diagnosis not present

## 2018-07-03 DIAGNOSIS — I10 Essential (primary) hypertension: Secondary | ICD-10-CM | POA: Diagnosis not present

## 2018-07-03 DIAGNOSIS — Z6825 Body mass index (BMI) 25.0-25.9, adult: Secondary | ICD-10-CM | POA: Diagnosis not present

## 2018-07-03 DIAGNOSIS — R51 Headache: Secondary | ICD-10-CM | POA: Diagnosis not present

## 2018-07-03 DIAGNOSIS — I739 Peripheral vascular disease, unspecified: Secondary | ICD-10-CM | POA: Diagnosis not present

## 2018-07-03 DIAGNOSIS — Z23 Encounter for immunization: Secondary | ICD-10-CM | POA: Diagnosis not present

## 2018-07-03 DIAGNOSIS — J449 Chronic obstructive pulmonary disease, unspecified: Secondary | ICD-10-CM | POA: Diagnosis not present

## 2018-07-03 DIAGNOSIS — Z299 Encounter for prophylactic measures, unspecified: Secondary | ICD-10-CM | POA: Diagnosis not present

## 2018-09-24 DIAGNOSIS — L280 Lichen simplex chronicus: Secondary | ICD-10-CM | POA: Diagnosis not present

## 2018-09-24 DIAGNOSIS — L570 Actinic keratosis: Secondary | ICD-10-CM | POA: Diagnosis not present

## 2018-09-24 DIAGNOSIS — L821 Other seborrheic keratosis: Secondary | ICD-10-CM | POA: Diagnosis not present

## 2018-12-26 DIAGNOSIS — H6123 Impacted cerumen, bilateral: Secondary | ICD-10-CM | POA: Diagnosis not present

## 2019-01-02 DIAGNOSIS — Z79899 Other long term (current) drug therapy: Secondary | ICD-10-CM | POA: Diagnosis not present

## 2019-01-02 DIAGNOSIS — E7800 Pure hypercholesterolemia, unspecified: Secondary | ICD-10-CM | POA: Diagnosis not present

## 2019-01-02 DIAGNOSIS — E559 Vitamin D deficiency, unspecified: Secondary | ICD-10-CM | POA: Diagnosis not present

## 2019-01-02 DIAGNOSIS — Z1331 Encounter for screening for depression: Secondary | ICD-10-CM | POA: Diagnosis not present

## 2019-01-02 DIAGNOSIS — I10 Essential (primary) hypertension: Secondary | ICD-10-CM | POA: Diagnosis not present

## 2019-01-02 DIAGNOSIS — R5383 Other fatigue: Secondary | ICD-10-CM | POA: Diagnosis not present

## 2019-01-02 DIAGNOSIS — Z6826 Body mass index (BMI) 26.0-26.9, adult: Secondary | ICD-10-CM | POA: Diagnosis not present

## 2019-01-02 DIAGNOSIS — Z299 Encounter for prophylactic measures, unspecified: Secondary | ICD-10-CM | POA: Diagnosis not present

## 2019-01-02 DIAGNOSIS — Z7189 Other specified counseling: Secondary | ICD-10-CM | POA: Diagnosis not present

## 2019-01-02 DIAGNOSIS — Z1339 Encounter for screening examination for other mental health and behavioral disorders: Secondary | ICD-10-CM | POA: Diagnosis not present

## 2019-01-02 DIAGNOSIS — Z0000 Encounter for general adult medical examination without abnormal findings: Secondary | ICD-10-CM | POA: Diagnosis not present

## 2019-01-07 DIAGNOSIS — J449 Chronic obstructive pulmonary disease, unspecified: Secondary | ICD-10-CM | POA: Diagnosis not present

## 2019-03-06 DIAGNOSIS — E2839 Other primary ovarian failure: Secondary | ICD-10-CM | POA: Diagnosis not present

## 2019-04-23 DIAGNOSIS — Z6825 Body mass index (BMI) 25.0-25.9, adult: Secondary | ICD-10-CM | POA: Diagnosis not present

## 2019-04-23 DIAGNOSIS — G309 Alzheimer's disease, unspecified: Secondary | ICD-10-CM | POA: Diagnosis not present

## 2019-04-23 DIAGNOSIS — Z299 Encounter for prophylactic measures, unspecified: Secondary | ICD-10-CM | POA: Diagnosis not present

## 2019-04-23 DIAGNOSIS — F0280 Dementia in other diseases classified elsewhere without behavioral disturbance: Secondary | ICD-10-CM | POA: Diagnosis not present

## 2019-04-23 DIAGNOSIS — J069 Acute upper respiratory infection, unspecified: Secondary | ICD-10-CM | POA: Diagnosis not present

## 2019-04-23 DIAGNOSIS — I10 Essential (primary) hypertension: Secondary | ICD-10-CM | POA: Diagnosis not present

## 2019-05-08 DIAGNOSIS — J471 Bronchiectasis with (acute) exacerbation: Secondary | ICD-10-CM | POA: Diagnosis not present

## 2019-06-03 DIAGNOSIS — Z23 Encounter for immunization: Secondary | ICD-10-CM | POA: Diagnosis not present

## 2019-06-26 DIAGNOSIS — H6123 Impacted cerumen, bilateral: Secondary | ICD-10-CM | POA: Diagnosis not present

## 2019-08-17 DIAGNOSIS — R350 Frequency of micturition: Secondary | ICD-10-CM | POA: Diagnosis not present

## 2019-08-17 DIAGNOSIS — I10 Essential (primary) hypertension: Secondary | ICD-10-CM | POA: Diagnosis not present

## 2019-08-17 DIAGNOSIS — F0280 Dementia in other diseases classified elsewhere without behavioral disturbance: Secondary | ICD-10-CM | POA: Diagnosis not present

## 2019-08-17 DIAGNOSIS — Z789 Other specified health status: Secondary | ICD-10-CM | POA: Diagnosis not present

## 2019-08-17 DIAGNOSIS — Z299 Encounter for prophylactic measures, unspecified: Secondary | ICD-10-CM | POA: Diagnosis not present

## 2019-08-17 DIAGNOSIS — J449 Chronic obstructive pulmonary disease, unspecified: Secondary | ICD-10-CM | POA: Diagnosis not present

## 2019-08-17 DIAGNOSIS — G309 Alzheimer's disease, unspecified: Secondary | ICD-10-CM | POA: Diagnosis not present

## 2019-08-17 DIAGNOSIS — Z6827 Body mass index (BMI) 27.0-27.9, adult: Secondary | ICD-10-CM | POA: Diagnosis not present

## 2019-08-25 DIAGNOSIS — J9611 Chronic respiratory failure with hypoxia: Secondary | ICD-10-CM | POA: Diagnosis not present

## 2019-08-25 DIAGNOSIS — J441 Chronic obstructive pulmonary disease with (acute) exacerbation: Secondary | ICD-10-CM | POA: Diagnosis not present

## 2019-08-25 DIAGNOSIS — J449 Chronic obstructive pulmonary disease, unspecified: Secondary | ICD-10-CM | POA: Diagnosis not present

## 2019-08-27 ENCOUNTER — Emergency Department (HOSPITAL_COMMUNITY): Payer: Medicare Other

## 2019-08-27 ENCOUNTER — Other Ambulatory Visit: Payer: Self-pay

## 2019-08-27 ENCOUNTER — Inpatient Hospital Stay (HOSPITAL_COMMUNITY)
Admission: EM | Admit: 2019-08-27 | Discharge: 2019-09-14 | DRG: 193 | Disposition: E | Payer: Medicare Other | Attending: Family Medicine | Admitting: Family Medicine

## 2019-08-27 ENCOUNTER — Encounter (HOSPITAL_COMMUNITY): Payer: Self-pay

## 2019-08-27 DIAGNOSIS — Z885 Allergy status to narcotic agent status: Secondary | ICD-10-CM | POA: Diagnosis not present

## 2019-08-27 DIAGNOSIS — R0902 Hypoxemia: Secondary | ICD-10-CM

## 2019-08-27 DIAGNOSIS — Z20822 Contact with and (suspected) exposure to covid-19: Secondary | ICD-10-CM | POA: Diagnosis present

## 2019-08-27 DIAGNOSIS — J440 Chronic obstructive pulmonary disease with acute lower respiratory infection: Secondary | ICD-10-CM | POA: Diagnosis present

## 2019-08-27 DIAGNOSIS — F03918 Unspecified dementia, unspecified severity, with other behavioral disturbance: Secondary | ICD-10-CM | POA: Diagnosis present

## 2019-08-27 DIAGNOSIS — Z96649 Presence of unspecified artificial hip joint: Secondary | ICD-10-CM | POA: Diagnosis present

## 2019-08-27 DIAGNOSIS — M7989 Other specified soft tissue disorders: Secondary | ICD-10-CM | POA: Diagnosis not present

## 2019-08-27 DIAGNOSIS — J42 Unspecified chronic bronchitis: Secondary | ICD-10-CM | POA: Diagnosis not present

## 2019-08-27 DIAGNOSIS — J90 Pleural effusion, not elsewhere classified: Secondary | ICD-10-CM | POA: Diagnosis not present

## 2019-08-27 DIAGNOSIS — J9600 Acute respiratory failure, unspecified whether with hypoxia or hypercapnia: Secondary | ICD-10-CM | POA: Insufficient documentation

## 2019-08-27 DIAGNOSIS — Z515 Encounter for palliative care: Secondary | ICD-10-CM

## 2019-08-27 DIAGNOSIS — Z886 Allergy status to analgesic agent status: Secondary | ICD-10-CM | POA: Diagnosis not present

## 2019-08-27 DIAGNOSIS — R0600 Dyspnea, unspecified: Secondary | ICD-10-CM | POA: Diagnosis not present

## 2019-08-27 DIAGNOSIS — Z880 Allergy status to penicillin: Secondary | ICD-10-CM

## 2019-08-27 DIAGNOSIS — F0391 Unspecified dementia with behavioral disturbance: Secondary | ICD-10-CM | POA: Diagnosis present

## 2019-08-27 DIAGNOSIS — J9601 Acute respiratory failure with hypoxia: Secondary | ICD-10-CM | POA: Diagnosis present

## 2019-08-27 DIAGNOSIS — I5032 Chronic diastolic (congestive) heart failure: Secondary | ICD-10-CM | POA: Diagnosis present

## 2019-08-27 DIAGNOSIS — I110 Hypertensive heart disease with heart failure: Secondary | ICD-10-CM | POA: Diagnosis present

## 2019-08-27 DIAGNOSIS — Z7902 Long term (current) use of antithrombotics/antiplatelets: Secondary | ICD-10-CM | POA: Diagnosis not present

## 2019-08-27 DIAGNOSIS — J189 Pneumonia, unspecified organism: Secondary | ICD-10-CM | POA: Diagnosis present

## 2019-08-27 DIAGNOSIS — Z7951 Long term (current) use of inhaled steroids: Secondary | ICD-10-CM | POA: Diagnosis not present

## 2019-08-27 DIAGNOSIS — Z7189 Other specified counseling: Secondary | ICD-10-CM

## 2019-08-27 DIAGNOSIS — Z452 Encounter for adjustment and management of vascular access device: Secondary | ICD-10-CM | POA: Diagnosis not present

## 2019-08-27 DIAGNOSIS — R069 Unspecified abnormalities of breathing: Secondary | ICD-10-CM | POA: Diagnosis not present

## 2019-08-27 DIAGNOSIS — I361 Nonrheumatic tricuspid (valve) insufficiency: Secondary | ICD-10-CM | POA: Diagnosis not present

## 2019-08-27 DIAGNOSIS — Z66 Do not resuscitate: Secondary | ICD-10-CM | POA: Diagnosis not present

## 2019-08-27 DIAGNOSIS — R6889 Other general symptoms and signs: Secondary | ICD-10-CM

## 2019-08-27 DIAGNOSIS — R05 Cough: Secondary | ICD-10-CM | POA: Diagnosis not present

## 2019-08-27 DIAGNOSIS — I10 Essential (primary) hypertension: Secondary | ICD-10-CM | POA: Diagnosis present

## 2019-08-27 DIAGNOSIS — R0602 Shortness of breath: Secondary | ICD-10-CM | POA: Diagnosis not present

## 2019-08-27 HISTORY — DX: Unspecified dementia, unspecified severity, without behavioral disturbance, psychotic disturbance, mood disturbance, and anxiety: F03.90

## 2019-08-27 HISTORY — DX: Chronic obstructive pulmonary disease, unspecified: J44.9

## 2019-08-27 HISTORY — DX: Essential (primary) hypertension: I10

## 2019-08-27 LAB — LACTIC ACID, PLASMA: Lactic Acid, Venous: 1.5 mmol/L (ref 0.5–1.9)

## 2019-08-27 LAB — CBC WITH DIFFERENTIAL/PLATELET
Abs Immature Granulocytes: 0.04 10*3/uL (ref 0.00–0.07)
Basophils Absolute: 0 10*3/uL (ref 0.0–0.1)
Basophils Relative: 0 %
Eosinophils Absolute: 0 10*3/uL (ref 0.0–0.5)
Eosinophils Relative: 0 %
HCT: 45.1 % (ref 36.0–46.0)
Hemoglobin: 14.4 g/dL (ref 12.0–15.0)
Immature Granulocytes: 0 %
Lymphocytes Relative: 10 %
Lymphs Abs: 1.5 10*3/uL (ref 0.7–4.0)
MCH: 31.9 pg (ref 26.0–34.0)
MCHC: 31.9 g/dL (ref 30.0–36.0)
MCV: 100 fL (ref 80.0–100.0)
Monocytes Absolute: 0.6 10*3/uL (ref 0.1–1.0)
Monocytes Relative: 4 %
Neutro Abs: 12.6 10*3/uL — ABNORMAL HIGH (ref 1.7–7.7)
Neutrophils Relative %: 86 %
Platelets: 271 10*3/uL (ref 150–400)
RBC: 4.51 MIL/uL (ref 3.87–5.11)
RDW: 13.2 % (ref 11.5–15.5)
WBC: 14.8 10*3/uL — ABNORMAL HIGH (ref 4.0–10.5)
nRBC: 0 % (ref 0.0–0.2)

## 2019-08-27 LAB — BLOOD GAS, VENOUS
Acid-Base Excess: 1.9 mmol/L (ref 0.0–2.0)
Bicarbonate: 24.1 mmol/L (ref 20.0–28.0)
FIO2: 80
O2 Saturation: 55.2 %
Patient temperature: 36.5
pCO2, Ven: 48.3 mmHg (ref 44.0–60.0)
pH, Ven: 7.362 (ref 7.250–7.430)
pO2, Ven: 33.9 mmHg (ref 32.0–45.0)

## 2019-08-27 LAB — BRAIN NATRIURETIC PEPTIDE: B Natriuretic Peptide: 189 pg/mL — ABNORMAL HIGH (ref 0.0–100.0)

## 2019-08-27 LAB — BASIC METABOLIC PANEL
Anion gap: 8 (ref 5–15)
BUN: 28 mg/dL — ABNORMAL HIGH (ref 8–23)
CO2: 24 mmol/L (ref 22–32)
Calcium: 9.5 mg/dL (ref 8.9–10.3)
Chloride: 105 mmol/L (ref 98–111)
Creatinine, Ser: 1.01 mg/dL — ABNORMAL HIGH (ref 0.44–1.00)
GFR calc Af Amer: 56 mL/min — ABNORMAL LOW (ref >60)
GFR calc non Af Amer: 48 mL/min — ABNORMAL LOW (ref >60)
Glucose, Bld: 108 mg/dL — ABNORMAL HIGH (ref 70–99)
Potassium: 3.8 mmol/L (ref 3.5–5.1)
Sodium: 137 mmol/L (ref 135–145)

## 2019-08-27 LAB — RESPIRATORY PANEL BY RT PCR (FLU A&B, COVID)
Influenza A by PCR: NEGATIVE
Influenza B by PCR: NEGATIVE
SARS Coronavirus 2 by RT PCR: NEGATIVE

## 2019-08-27 LAB — POC SARS CORONAVIRUS 2 AG -  ED: SARS Coronavirus 2 Ag: NEGATIVE

## 2019-08-27 MED ORDER — VANCOMYCIN HCL 1750 MG/350ML IV SOLN
1750.0000 mg | Freq: Once | INTRAVENOUS | Status: AC
  Administered 2019-08-27: 1750 mg via INTRAVENOUS
  Filled 2019-08-27: qty 350

## 2019-08-27 MED ORDER — ACETAMINOPHEN 325 MG PO TABS
650.0000 mg | ORAL_TABLET | Freq: Four times a day (QID) | ORAL | Status: DC | PRN
  Administered 2019-08-28: 650 mg via ORAL
  Filled 2019-08-27: qty 2

## 2019-08-27 MED ORDER — ACETAMINOPHEN 650 MG RE SUPP: 650.0000 mg | Freq: Four times a day (QID) | RECTAL | Status: DC | PRN

## 2019-08-27 MED ORDER — SODIUM CHLORIDE 0.9 % IV BOLUS
1000.0000 mL | Freq: Once | INTRAVENOUS | Status: AC
  Administered 2019-08-27: 16:00:00 1000 mL via INTRAVENOUS

## 2019-08-27 MED ORDER — METHYLPREDNISOLONE SODIUM SUCC 40 MG IJ SOLR
40.0000 mg | Freq: Two times a day (BID) | INTRAMUSCULAR | Status: DC
  Administered 2019-08-27 – 2019-08-31 (×9): 40 mg via INTRAVENOUS
  Filled 2019-08-27 (×9): qty 1

## 2019-08-27 MED ORDER — SODIUM CHLORIDE 0.9 % IV SOLN
500.0000 mg | INTRAVENOUS | Status: DC
  Administered 2019-08-27 – 2019-08-31 (×5): 500 mg via INTRAVENOUS
  Filled 2019-08-27 (×6): qty 500

## 2019-08-27 MED ORDER — POLYETHYLENE GLYCOL 3350 17 G PO PACK: 17.0000 g | PACK | Freq: Every day | ORAL | Status: DC | PRN

## 2019-08-27 MED ORDER — ENOXAPARIN SODIUM 40 MG/0.4ML ~~LOC~~ SOLN
40.0000 mg | SUBCUTANEOUS | Status: DC
  Administered 2019-08-27 – 2019-08-31 (×5): 40 mg via SUBCUTANEOUS
  Filled 2019-08-27 (×5): qty 0.4

## 2019-08-27 MED ORDER — IPRATROPIUM-ALBUTEROL 0.5-2.5 (3) MG/3ML IN SOLN
3.0000 mL | Freq: Four times a day (QID) | RESPIRATORY_TRACT | Status: AC
  Administered 2019-08-27 – 2019-08-28 (×4): 3 mL via RESPIRATORY_TRACT
  Filled 2019-08-27 (×4): qty 3

## 2019-08-27 MED ORDER — IPRATROPIUM-ALBUTEROL 0.5-2.5 (3) MG/3ML IN SOLN
3.0000 mL | RESPIRATORY_TRACT | Status: DC | PRN
  Administered 2019-08-29: 3 mL via RESPIRATORY_TRACT
  Filled 2019-08-27: qty 3

## 2019-08-27 MED ORDER — ONDANSETRON HCL 4 MG/2ML IJ SOLN: 4.0000 mg | Freq: Four times a day (QID) | INTRAMUSCULAR | Status: DC | PRN

## 2019-08-27 MED ORDER — PIPERACILLIN-TAZOBACTAM 3.375 G IVPB
3.3750 g | Freq: Three times a day (TID) | INTRAVENOUS | Status: DC
  Administered 2019-08-27: 3.375 g via INTRAVENOUS
  Filled 2019-08-27: qty 50

## 2019-08-27 MED ORDER — SODIUM CHLORIDE 0.9 % IV SOLN
1.0000 g | INTRAVENOUS | Status: DC
  Administered 2019-08-28: 1 g via INTRAVENOUS
  Filled 2019-08-27: qty 10

## 2019-08-27 MED ORDER — RIVASTIGMINE 9.5 MG/24HR TD PT24
9.5000 mg | MEDICATED_PATCH | Freq: Every day | TRANSDERMAL | Status: DC
  Administered 2019-08-28 – 2019-08-31 (×4): 9.5 mg via TRANSDERMAL
  Filled 2019-08-27 (×7): qty 1

## 2019-08-27 MED ORDER — VANCOMYCIN HCL IN DEXTROSE 1-5 GM/200ML-% IV SOLN: 1000.0000 mg | INTRAVENOUS | Status: DC

## 2019-08-27 MED ORDER — CLOPIDOGREL BISULFATE 75 MG PO TABS
75.0000 mg | ORAL_TABLET | Freq: Every day | ORAL | Status: DC
  Administered 2019-08-28 – 2019-08-30 (×2): 75 mg via ORAL
  Filled 2019-08-27 (×3): qty 1

## 2019-08-27 MED ORDER — GUAIFENESIN-DM 100-10 MG/5ML PO SYRP
10.0000 mL | ORAL_SOLUTION | Freq: Three times a day (TID) | ORAL | Status: AC
  Administered 2019-08-27 – 2019-08-28 (×3): 10 mL via ORAL
  Filled 2019-08-27 (×3): qty 10

## 2019-08-27 MED ORDER — ONDANSETRON HCL 4 MG PO TABS: 4.0000 mg | ORAL_TABLET | Freq: Four times a day (QID) | ORAL | Status: DC | PRN

## 2019-08-27 MED ORDER — NIFEDIPINE ER OSMOTIC RELEASE 30 MG PO TB24
30.0000 mg | ORAL_TABLET | Freq: Every day | ORAL | Status: DC
  Administered 2019-08-27 – 2019-08-30 (×3): 30 mg via ORAL
  Filled 2019-08-27 (×4): qty 1

## 2019-08-27 NOTE — Progress Notes (Signed)
Pharmacy Antibiotic Note  Jill Mcpherson is a 86 y.o. female admitted on 09/14/19 with pneumonia.  Pharmacy has been consulted for vancomycin and zosyn dosing.  Plan: vancomycin 1.75gm iv x 1  Vancomycin 1000mg  IV every 24 hours.  Goal trough 15-20 mcg/mL. Zosyn 3.375g IV q8h (4 hour infusion).  Height: 5\' 2"  (157.5 cm) Weight: 160 lb (72.6 kg) IBW/kg (Calculated) : 50.1  Temp (24hrs), Avg:97.7 F (36.5 C), Min:97.7 F (36.5 C), Max:97.7 F (36.5 C)  Recent Labs  Lab 14-Sep-2019 1446  WBC 14.8*  CREATININE 1.01*    Estimated Creatinine Clearance: 33.2 mL/min (A) (by C-G formula based on SCr of 1.01 mg/dL (H)).    Allergies  Allergen Reactions  . Amoxil [Amoxicillin] Nausea And Vomiting  . Asa [Aspirin]   . Morphine And Related     Antimicrobials this admission: 2/11 vancomycin >>  2/11 zosyn >>   Microbiology results: 2/11 BCx: sent 2/11 MRSA PCR: sent   Thank you for allowing pharmacy to be a part of this patient's care.  4/11 Calum Cormier 09-14-19 3:47 PM

## 2019-08-27 NOTE — H&P (Signed)
History and Physical    Jill Mcpherson JXB:[id number] DOB: 04-25-1925 DOA: 09-Sep-2019  PCP: Kirstie Peri, MD   Patient coming from: Home  I have personally briefly reviewed patient's old medical records in [facility]  Chief Complaint: SOB, cough  HPI: Jill Mcpherson is a 86 y.o. female with medical history significant for COPD, dementia, hypertension.  Patient was brought to the ED by her son- Ardelle Balls who lives with her.  Patient has been having difficulty breathing and productive cough over the past week.  Patient was started on steroids 3 days ago which she has been compliant with and mostly helped her breathing until yesterday, but the steroids also made her a bit hyperactive.  She was also started on home O2 yesterday at 2 L.  Patient's son denies any confusion or change in mental status.  She has baseline dementia with memory problems that is unchanged. Patient son also noticed that her left lower extremity was more swollen than her right yesterday, but this appears to have improved today.  No redness or warmth of lower extremity. Patient denies chest pain.   ED Course: O2 sats 57% on room air.  Patient was placed on nasal cannula with sats 91% and subsequently changed to nonrebreather mask.  Focal bilateral airspace disease which could reflect pneumonia or edema.  Negative for COVID-19 and influenza.  BNP elevated at 189.  Normal lactic acid 1.5.  Venous blood gas PCO2 of 48. Patient was started on IV vancomycin and Zosyn.  Hospitalist to admit for pneumonia.  Review of Systems: As per HPI all other systems reviewed and negative.  Past Medical History:  Diagnosis Date  . COPD (chronic obstructive pulmonary disease) (HCC)   . Dementia (HCC)   . Hypertension     Past Surgical History:  Procedure Laterality Date  . ABDOMINAL HYSTERECTOMY    . CHOLECYSTECTOMY    . TOTAL HIP ARTHROPLASTY       has no history on file for tobacco, alcohol, and drug.  Allergies  Allergen Reactions   . Amoxil [Amoxicillin] Nausea And Vomiting  . Asa [Aspirin]     Unknown reaction  . Morphine And Related     Unknown reaction    No family history on file.  Prior to Admission medications   Medication Sig Start Date End Date Taking? Authorizing Provider  acetaminophen (TYLENOL) 500 MG tablet Take 500 mg by mouth daily as needed.   Yes [provider]  ADVAIR DISKUS 250-50 MCG/DOSE AEPB Inhale 1 puff into the lungs 2 (two) times daily.  06/16/19  Yes [provider]  albuterol (PROVENTIL) (2.5 MG/3ML) 0.083% nebulizer solution Take 2.5 mg by nebulization every 6 (six) hours as needed for wheezing or shortness of breath.  07/26/19  Yes [provider]  azithromycin (ZITHROMAX) 250 MG tablet Take 250-500 mg by mouth See admin instructions. Starting on 08/25/2019 take 500mg  on day 1 then take 250mg  on days 2 through 5.   Yes [provider]  Cholecalciferol 25 MCG (1000 UT) capsule Take 1,000 Units by mouth daily.    Yes [provider]  clopidogrel (PLAVIX) 75 MG tablet Take 75 mg by mouth daily.    Yes [provider]  loratadine (CLARITIN) 10 MG tablet Take 10 mg by mouth daily.   Yes [provider]  NIFEdipine (PROCARDIA-XL/NIFEDICAL-XL) 30 MG 24 hr tablet Take 30 mg by mouth daily. 06/26/19  Yes [provider]  predniSONE (DELTASONE) 10 MG tablet Take 10 mg by mouth  See admin instructions. Starting on 08/25/2019 on tapered course take 10mg  three times daily for 3 days, then take 10mg  twice daily for 3 days, then take 10mg  once daily for 1 day, then STOP.   Yes [provider]  rivastigmine (EXELON) 9.5 mg/24hr Place 1 patch onto the skin daily.   Yes [provider]  umeclidinium bromide (INCRUSE ELLIPTA) 62.5 MCG/INH AEPB Inhale 1 puff into the lungs daily.    Yes [provider]    Physical Exam: Vitals:   09/03/2019 1728 09/05/2019 1745 09/02/2019 1800 08/19/2019 1815  BP:  (!) 153/67 (!) 152/63  (!) 161/74  Pulse:  80 82 77  Resp:  (!) 31 (!) 21 (!) 24  Temp: (!) 97.1 F (36.2 C)     TempSrc: Rectal     SpO2:  100% 100% 100%  Weight:      Height:        Constitutional: NAD, calm, comfortable Vitals:   09/03/2019 1728 09/08/2019 1745 08/29/2019 1800 09/04/2019 1815  BP:  (!) 153/67 (!) 152/63 (!) 161/74  Pulse:  80 82 77  Resp:  (!) 31 (!) 21 (!) 24  Temp: (!) 97.1 F (36.2 C)     TempSrc: Rectal     SpO2:  100% 100% 100%  Weight:      Height:       Eyes: PERRL, lids and conjunctivae normal ENMT: Mucous membranes are moist. Posterior pharynx clear of any exudate or lesions.Normal dentition.  Neck: normal, supple, no masses, no thyromegaly Respiratory: On nonrebreather, clear to auscultation bilaterally, no wheezing, no crackles. Normal respiratory effort. No accessory muscle use.  Cardiovascular: Regular rate and rhythm, no murmurs / rubs / gallops.  Trace L > R lower extremity edema. 2+ pedal pulses.  Abdomen: no tenderness, no masses palpated. No hepatosplenomegaly. Bowel sounds positive.  Musculoskeletal: no clubbing / cyanosis. No joint deformity upper and lower extremities. Good ROM, no contractures. Normal muscle tone.  Skin: no rashes, lesions, ulcers. No induration Neurologic: No gross cranial nerve abnormalities, 5/5 strength in all extremities. Psychiatric: Normal judgment and insight. Alert and oriented x 2. Normal mood.   Labs on Admission: I have personally reviewed following labs and imaging studies  CBC: Recent Labs  Lab 08/26/2019 1446  WBC 14.8*  NEUTROABS 12.6*  HGB 14.4  HCT 45.1  MCV 100.0  PLT 271   Basic Metabolic Panel: Recent Labs  Lab 09/04/2019 1446  NA 137  K 3.8  CL 105  CO2 24  GLUCOSE 108*  BUN 28*  CREATININE 1.01*  CALCIUM 9.5    Radiological Exams on Admission: DG Chest Port 1 View  Result Date: 08/26/2019 CLINICAL DATA:  Cough for 1 week, hypoxia EXAM: PORTABLE CHEST 1 VIEW COMPARISON:  07/12/2017 FINDINGS: Single frontal  view of the chest demonstrates unremarkable cardiac silhouette. Atherosclerosis of the aortic arch. Prominent calcification of the mitral annulus. There is multifocal bilateral airspace disease with relative sparing of the right upper lung zone. No large effusion or pneumothorax. No acute bony abnormalities. IMPRESSION: 1. Multifocal bilateral airspace disease which could reflect pneumonia or edema. Electronically Signed   By: 10/25/19 M.D.   On: 09/13/2019 15:16    EKG: Independently reviewed. Sinus rhythm, QTC 441.  No significant change from prior EKG.  Assessment/Plan Active Problems:   Acute respiratory failure (HCC)   Acute respiratory failure-O2 sats down to 57% on room air, currently on nonrebreather sats than 100%.  Likely secondary to pneumonia and possibly COPD component.  Currently alert and oriented.  Venous blood gas shows PCO2 of 48 -Wean off nonrebreather -Supplemental oxygen -Respiratory protocol.  Community-acquired pneumonia-productive cough, dyspnea.  Chest x-ray shows multifocal pneumonia.  Leukocytosis of 14.8.  Rules out for sepsis.  Normal lactic acid.  Respiratory panel negative for COVID-19 influenza.  Was started on azithromycin 08/25/2019 - IV vancomycin and Zosyn started in the ED, will use empiric CAP coverage with ceftriaxone and azithromycin - 1 L given in ED, hold off on further IV fluids. - Follow up blood cultures obtained in ED  COPD- has been on steroids and bronchodilators.  At this time no rhonchi or wheezing on exam. -Continue IV solumedrol 40 mg q12h -As needed and scheduled duo nebs -Mucolytic's scheduled  Lower extremity swelling-trace lower extremity edema left greater than right.  Chest x-ray shows pneumonia versus edema.  BNP mildly elevated at 189. -Venous Doppler left lower extremity.  Hypertension-systolic 009Q to 330Q.   -Resume home nifedipine.  Dementia-stable.  Mental status currently at baseline. Per son patient has moderate  memory problems, but she is independent of all ADLs, ambulates without assistance or assistive devices.  Patient lives with her son-who is Pharmacist, hospital. -Resume home rivastigmine.   DVT prophylaxis: Lovenox Code Status: Full code.  Discussed with patient and her son.  This has not been discussed prior to this admission.  Son is not fully aware of patient's wishes, but wants patient to be full code. Family Communication: Patient's son who is Pharmacist, hospital, present at bedside, all questions answered. Disposition Plan: Pending improvement in respiratory status Consults called: None none Admission status: Inpatient, stepdown I certify that at the point of admission it is my clinical judgment that the patient will require inpatient hospital care spanning beyond 2 midnights from the point of admission due to high intensity of service, high risk for further deterioration and high frequency of surveillance required. The following factors support the patient status of inpatient: Respiratory failure requiring supplemental oxygen.   Bethena Roys MD [facility]  08/18/2019, 7:00 PM

## 2019-08-27 NOTE — ED Triage Notes (Signed)
Pt brought to ER by son for low O2 sats in 70's today. Per son she was just started on O2 yesterday at 2L. Pt has had cough x 1 week, denies fever.

## 2019-08-27 NOTE — ED Provider Notes (Signed)
[facility] EMERGENCY DEPARTMENT Provider Note   CSN: [id number] Arrival date & time: 08/28/19  1413     History Chief Complaint  Patient presents with  . Low O2 Sats    Jill Mcpherson is a 86 y.o. female.  HPI She is here for evaluation of severe shortness of breath with low oxygen, which started last night.  She saw her pulmonologist, 3 days ago, and at that time was started on an antibiotic and prednisone, for COPD exacerbation.  Yesterday she was restless, associated with starting the prednisone.  Today she is lethargic and having increased trouble breathing.  She came here by private vehicle.  She was on oxygen during this long drive here.  She has not been coughing.  She is unable to give history.  History by family member with her.  Level 5 caveat-severe illness    Past Medical History:  Diagnosis Date  . COPD (chronic obstructive pulmonary disease) (HCC)   . Dementia (HCC)   . Hypertension     There are no problems to display for this patient.   Past Surgical History:  Procedure Laterality Date  . ABDOMINAL HYSTERECTOMY    . CHOLECYSTECTOMY    . TOTAL HIP ARTHROPLASTY       OB History   No obstetric history on file.     No family history on file.  Social History   Tobacco Use  . Smoking status: Not on file  Substance Use Topics  . Alcohol use: Not on file  . Drug use: Not on file    Home Medications Prior to Admission medications   Not on File    Allergies    Amoxil [amoxicillin], Asa [aspirin], and Morphine and related  Review of Systems   Review of Systems  Unable to perform ROS: Acuity of condition    Physical Exam Updated Vital Signs BP (!) 157/64   Pulse 74   Temp 97.7 F (36.5 C) (Oral)   Resp 16   Ht 5\' 2"  (1.575 m)   Wt 72.6 kg   SpO2 91%   BMI 29.26 kg/m   Physical Exam Vitals and nursing note reviewed.  Constitutional:      General: She is in acute distress.     Appearance: She is well-developed. She is  ill-appearing. She is not toxic-appearing or diaphoretic.  HENT:     Head: Normocephalic and atraumatic.     Right Ear: External ear normal.     Left Ear: External ear normal.  Eyes:     Conjunctiva/sclera: Conjunctivae normal.     Pupils: Pupils are equal, round, and reactive to light.  Neck:     Trachea: Phonation normal.  Cardiovascular:     Rate and Rhythm: Normal rate and regular rhythm.     Heart sounds: Normal heart sounds.  Pulmonary:     Effort: Respiratory distress present.     Breath sounds: No stridor.     Comments: Minimal to no breath sounds left. Abdominal:     General: There is no distension.     Palpations: Abdomen is soft.     Tenderness: There is no abdominal tenderness.  Musculoskeletal:        General: Normal range of motion.     Cervical back: Normal range of motion and neck supple.     Comments: Tender, swollen left lower leg.  Skin:    General: Skin is warm and dry.  Neurological:     Mental Status: She is alert and oriented  to person, place, and time.     Cranial Nerves: No cranial nerve deficit.     Sensory: No sensory deficit.     Motor: No abnormal muscle tone.     Coordination: Coordination normal.  Psychiatric:        Behavior: Behavior normal.        Thought Content: Thought content normal.        Judgment: Judgment normal.     ED Results / Procedures / Treatments   Labs (all labs ordered are listed, but only abnormal results are displayed) Labs Reviewed  BASIC METABOLIC PANEL - Abnormal; Notable for the following components:      Result Value   Glucose, Bld 108 (*)    BUN 28 (*)    Creatinine, Ser 1.01 (*)    GFR calc non Af Amer 48 (*)    GFR calc Af Amer 56 (*)    All other components within normal limits  CBC WITH DIFFERENTIAL/PLATELET - Abnormal; Notable for the following components:   WBC 14.8 (*)    Neutro Abs 12.6 (*)    All other components within normal limits  RESPIRATORY PANEL BY RT PCR (FLU A&B, COVID)  CULTURE,  BLOOD (ROUTINE X 2)  CULTURE, BLOOD (ROUTINE X 2)  MRSA PCR SCREENING  BLOOD GAS, VENOUS  LACTIC ACID, PLASMA  LACTIC ACID, PLASMA  BRAIN NATRIURETIC PEPTIDE  POC SARS CORONAVIRUS 2 AG -  ED    EKG EKG Interpretation  Date/Time:  Thursday 09-21-2019 14:39:41 EST Ventricular Rate:  76 PR Interval:    QRS Duration: 80 QT Interval:  392 QTC Calculation: 441 R Axis:   88 Text Interpretation: Sinus rhythm Borderline right axis deviation Nonspecific T abnormalities, lateral leads Baseline wander since last tracing no significant change Confirmed by Mancel Bale [phone]) on 09-21-2019 3:03:59 PM   Radiology DG Chest Port 1 View  Result Date: September 21, 2019 CLINICAL DATA:  Cough for 1 week, hypoxia EXAM: PORTABLE CHEST 1 VIEW COMPARISON:  07/12/2017 FINDINGS: Single frontal view of the chest demonstrates unremarkable cardiac silhouette. Atherosclerosis of the aortic arch. Prominent calcification of the mitral annulus. There is multifocal bilateral airspace disease with relative sparing of the right upper lung zone. No large effusion or pneumothorax. No acute bony abnormalities. IMPRESSION: 1. Multifocal bilateral airspace disease which could reflect pneumonia or edema. Electronically Signed   By: Sharlet Salina M.D.   On: 09-21-19 15:16    Procedures Procedures (including critical care time)  Medications Ordered in ED Medications  sodium chloride 0.9 % bolus 1,000 mL (has no administration in time range)  vancomycin (VANCOREADY) IVPB 1750 mg/350 mL (has no administration in time range)    ED Course  I have reviewed the triage vital signs and the nursing notes.  Pertinent labs & imaging results that were available during my care of the patient were reviewed by me and considered in my medical decision making (see chart for details).  Clinical Course as of Aug 26 1625  Thu 2019/09/21  1539 Normal except white count high, neutrophils high  CBC with Differential(!) [EW]  1539  Normal except glucose high, BUN high, creatinine high, GFR low  Basic metabolic panel(!) [EW]  1545 Oxygen saturation 93 percent on facemask oxygen.   [EW]  1546 Normal  Respiratory Panel by RT PCR (Flu A&B, Covid) - Nasopharyngeal Swab [EW]  1620 Mild elevation  Brain natriuretic peptide(!) [EW]    Clinical Course User Index [EW] Mancel Bale, MD  MDM Rules/Calculators/A&P                       Patient Vitals for the past 24 hrs:  BP Temp Temp src Pulse Resp SpO2 Height Weight  09/04/2019 1600 (!) 168/75 -- -- 80 19 91 % -- --  09/05/2019 1545 (!) 146/75 -- -- 74 20 91 % -- --  08/21/2019 1530 (!) 157/64 -- -- 74 16 91 % -- --  08/30/2019 1529 -- 97.7 F (36.5 C) Oral -- -- -- -- --  08/31/2019 1515 (!) 150/75 -- -- 76 19 91 % -- --  09/05/2019 1500 (!) 149/68 -- -- 73 (!) 21 91 % -- --  09/06/2019 1450 (!) 156/65 -- -- 71 20 90 % -- --  08/23/2019 1432 (!) 161/61 -- -- 80 18 (!) 57 % -- --  08/22/2019 1428 -- -- -- -- -- -- 5\' 2"  (1.575 m) 72.6 kg    4:26 PM Reevaluation with update and discussion. After initial assessment and treatment, an updated evaluation reveals she is more comfortable and able to talk better.  Oxygen saturation 98% on 100% facemask oxygen.  No lethargy.  Findings discussed with the patient's son who is with her.  He has not had any discussions with her about end-of-life care and she has no healthcare power of attorney.  I instructed the son that this may be necessary on both accounts and he stated that he understood that. Daleen Bo   Medical Decision Making: Patient being treated for COPD exacerbation, is worsened despite treatment as an outpatient, and now has bilateral pneumonia.  Covid testing negative.  Blood pressure is reassuring.  Initial lactate 1.5.  Broad-spectrum antibiotics started.  At this time the patient does not require advanced airway management.  Jill Mcpherson was evaluated in Emergency Department on 09/05/2019 for the symptoms described in the history  of present illness. She was evaluated in the context of the global COVID-19 pandemic, which necessitated consideration that the patient might be at risk for infection with the SARS-CoV-2 virus that causes COVID-19. Institutional protocols and algorithms that pertain to the evaluation of patients at risk for COVID-19 are in a state of rapid change based on information released by regulatory bodies including the CDC and federal and state organizations. These policies and algorithms were followed during the patient's care in the ED.   CRITICAL CARE-yes Performed by: Daleen Bo  Nursing Notes Reviewed/ Care Coordinated Applicable Imaging Reviewed Interpretation of Laboratory Data incorporated into ED treatment  4:30 PM-Consult complete with hospitalist. Patient case explained and discussed.  She agrees to admit patient for further evaluation and treatment. Call ended at 4:40 PM  Plan: Admit  Final Clinical Impression(s) / ED Diagnoses Final diagnoses:  None    Rx / DC Orders ED Discharge Orders    None       Daleen Bo, MD 08/28/2019 1643

## 2019-08-28 ENCOUNTER — Inpatient Hospital Stay (HOSPITAL_COMMUNITY): Payer: Medicare Other

## 2019-08-28 DIAGNOSIS — J9601 Acute respiratory failure with hypoxia: Secondary | ICD-10-CM | POA: Diagnosis present

## 2019-08-28 DIAGNOSIS — J189 Pneumonia, unspecified organism: Secondary | ICD-10-CM | POA: Diagnosis present

## 2019-08-28 DIAGNOSIS — I5032 Chronic diastolic (congestive) heart failure: Secondary | ICD-10-CM | POA: Diagnosis present

## 2019-08-28 DIAGNOSIS — F0391 Unspecified dementia with behavioral disturbance: Secondary | ICD-10-CM | POA: Diagnosis present

## 2019-08-28 DIAGNOSIS — I361 Nonrheumatic tricuspid (valve) insufficiency: Secondary | ICD-10-CM

## 2019-08-28 DIAGNOSIS — I10 Essential (primary) hypertension: Secondary | ICD-10-CM | POA: Diagnosis present

## 2019-08-28 DIAGNOSIS — F03918 Unspecified dementia, unspecified severity, with other behavioral disturbance: Secondary | ICD-10-CM | POA: Diagnosis present

## 2019-08-28 LAB — BASIC METABOLIC PANEL
Anion gap: 10 (ref 5–15)
BUN: 20 mg/dL (ref 8–23)
CO2: 23 mmol/L (ref 22–32)
Calcium: 9 mg/dL (ref 8.9–10.3)
Chloride: 108 mmol/L (ref 98–111)
Creatinine, Ser: 0.79 mg/dL (ref 0.44–1.00)
GFR calc Af Amer: >60 mL/min (ref >60)
GFR calc non Af Amer: >60 mL/min (ref >60)
Glucose, Bld: 142 mg/dL — ABNORMAL HIGH (ref 70–99)
Potassium: 4.6 mmol/L (ref 3.5–5.1)
Sodium: 141 mmol/L (ref 135–145)

## 2019-08-28 LAB — CBC
HCT: 43.5 % (ref 36.0–46.0)
Hemoglobin: 13.9 g/dL (ref 12.0–15.0)
MCH: 31.5 pg (ref 26.0–34.0)
MCHC: 32 g/dL (ref 30.0–36.0)
MCV: 98.6 fL (ref 80.0–100.0)
Platelets: 276 10*3/uL (ref 150–400)
RBC: 4.41 MIL/uL (ref 3.87–5.11)
RDW: 13.2 % (ref 11.5–15.5)
WBC: 8.5 10*3/uL (ref 4.0–10.5)
nRBC: 0 % (ref 0.0–0.2)

## 2019-08-28 LAB — ECHOCARDIOGRAM COMPLETE
Height: 62 in
Weight: 2444.46 oz

## 2019-08-28 LAB — MRSA PCR SCREENING: MRSA by PCR: NEGATIVE

## 2019-08-28 MED ORDER — SODIUM CHLORIDE 0.9 % IV SOLN
2.0000 g | INTRAVENOUS | Status: DC
  Administered 2019-08-29 – 2019-08-31 (×3): 2 g via INTRAVENOUS
  Filled 2019-08-28 (×3): qty 20

## 2019-08-28 MED ORDER — LORAZEPAM 2 MG/ML IJ SOLN
1.0000 mg | Freq: Four times a day (QID) | INTRAMUSCULAR | Status: DC | PRN
  Administered 2019-08-28 – 2019-08-30 (×4): 1 mg via INTRAVENOUS
  Filled 2019-08-28 (×4): qty 1

## 2019-08-28 MED ORDER — CHLORHEXIDINE GLUCONATE CLOTH 2 % EX PADS
6.0000 | MEDICATED_PAD | Freq: Every day | CUTANEOUS | Status: DC
  Administered 2019-08-28 – 2019-09-01 (×4): 6 via TOPICAL

## 2019-08-28 MED ORDER — FUROSEMIDE 10 MG/ML IJ SOLN
20.0000 mg | Freq: Once | INTRAMUSCULAR | Status: AC
  Administered 2019-08-28: 20 mg via INTRAVENOUS
  Filled 2019-08-28: qty 2

## 2019-08-28 NOTE — Progress Notes (Signed)
*  PRELIMINARY RESULTS* Echocardiogram 2D Echocardiogram has been performed.  Jill Mcpherson 08/28/2019, 3:11 PM

## 2019-08-28 NOTE — Progress Notes (Addendum)
Patient Demographics:    Jill Mcpherson, is a 86 y.o. female, DOB - [birth year], NGE:[id number]  Admit date - 09/05/2019   Admitting Physician Onnie Boer, MD  Outpatient Primary MD for the patient is Jill Peri, MD  LOS - 1   Chief Complaint  Patient presents with   Low O2 Sats        Subjective:    Jill Mcpherson today has no fevers, no emesis,  No chest pain,   -Son at bedside -Dyspnea and cough persist  Assessment  & Plan :    Principal Problem:   Acute respiratory failure with hypoxia (HCC) Active Problems:   Multifocal pneumonia   HTN (hypertension)   Chronic heart failure with preserved ejection fraction (HFpEF) (HCC)   Dementia with behavioral disturbance [facility])  Brief Summary: 86 y.o. female with medical history significant for COPD, dementia, hypertension admitted on 08/21/2019 with acute hypoxic respiratory failure in the setting of multifocal pneumonia with some concerns about CHF as well  A/p 1) multifocal pneumonia----leukocytosis may be due to recent steroid use -Continue Rocephin/azithromycin along with mucolytics and bronchodilators -Cough, dyspnea and hypoxia persist  2) acute hypoxic respiratory failure--- secondary to #1 above, echo pending  3) lower extremity swelling--- left more than right, tvenous Dopplers negative for DVT, used TED stockings gentle diuresis  4)HFpEF--- patient with chronic diastolic dysfunction CHF, EF 65 to 70%, with grade 1 diastolic dysfunction--gentle diuresis as ordered  Disposition/Need for in-Hospital Stay- patient unable to be discharged at this time due to --- persistent hypoxia, cough and respiratory symptoms--- requiring IV antibiotics, not medically ready for discharge  Code Status : FULL  Family Communication:Discussed with son at bedside  Consults  :  NA  DVT Prophylaxis  :  Lovenox -  Lab Results  Component Value Date     PLT 276 08/28/2019    Inpatient Medications  Scheduled Meds:  Chlorhexidine Gluconate Cloth  6 each Topical Daily   clopidogrel  75 mg Oral Daily   enoxaparin (LOVENOX) injection  40 mg Subcutaneous Q24H   guaiFENesin-dextromethorphan  10 mL Oral Q8H   ipratropium-albuterol  3 mL Nebulization Q6H   methylPREDNISolone (SOLU-MEDROL) injection  40 mg Intravenous Q12H   NIFEdipine  30 mg Oral Daily   rivastigmine  9.5 mg Transdermal Daily   Continuous Infusions:  azithromycin Stopped (09/08/2019 2133)   [START ON 08/29/2019] cefTRIAXone (ROCEPHIN)  IV     PRN Meds:.acetaminophen **OR** acetaminophen, ipratropium-albuterol, LORazepam, ondansetron **OR** ondansetron (ZOFRAN) IV, polyethylene glycol    Anti-infectives (From admission, onward)   Start     Dose/Rate Route Frequency Ordered Stop   08/29/19 0700  cefTRIAXone (ROCEPHIN) 2 g in sodium chloride 0.9 % 100 mL IVPB     2 g 200 mL/hr over 30 Minutes Intravenous Every 24 hours 08/28/19 1310     08/28/19 1500  vancomycin (VANCOCIN) IVPB 1000 mg/200 mL premix  Status:  Discontinued     1,000 mg 200 mL/hr over 60 Minutes Intravenous Every 24 hours 08/24/2019 1547 08/24/2019 1928   08/28/19 0700  cefTRIAXone (ROCEPHIN) 1 g in sodium chloride 0.9 % 100 mL IVPB  Status:  Discontinued     1 g 200 mL/hr over 30 Minutes Intravenous Every 24 hours 09/13/2019 1928 08/28/19  1310   09/11/2019 1930  azithromycin (ZITHROMAX) 500 mg in sodium chloride 0.9 % 250 mL IVPB     500 mg 250 mL/hr over 60 Minutes Intravenous Every 24 hours 09/12/2019 1928     08/26/2019 1600  piperacillin-tazobactam (ZOSYN) IVPB 3.375 g  Status:  Discontinued     3.375 g 12.5 mL/hr over 240 Minutes Intravenous Every 8 hours 08/28/2019 1546 08/23/2019 1928   09/13/2019 1545  vancomycin (VANCOREADY) IVPB 1750 mg/350 mL     1,750 mg 175 mL/hr over 120 Minutes Intravenous  Once 08/24/2019 1541 08/29/2019 1812        Objective:   Vitals:   08/28/19 1000 08/28/19 1100 08/28/19  1137 08/28/19 1555  BP: (!) 160/61     Pulse: 89 78    Resp: (!) 22 (!) 23    Temp:   97.9 F (36.6 C) 97.9 F (36.6 C)  TempSrc:   Oral Oral  SpO2: 92% 95%    Weight:      Height:        Wt Readings from Last 3 Encounters:  08/28/19 69.3 kg     Intake/Output Summary (Last 24 hours) at 08/28/2019 1828 Last data filed at 08/28/2019 1400 Gross per 24 hour  Intake 719.42 ml  Output 1500 ml  Net -780.58 ml     Physical Exam  Gen:- Awake Alert, coughs intermittently  HEENT:- [place].AT, No sclera icterus Nose- [place] 2 L/min Neck-Supple Neck,No JVD,.  Lungs-scattered rhonchi, few scattered wheezes, diminished breath sounds  CV- S1, S2 normal, regular  Abd-  +ve B.Sounds, Abd Soft, No tenderness,    Extremity/Skin:- No  edema, pedal pulses present  Psych-baseline cognitive, memory deficits with episodes of confusion and disorientation  neuro-generalized weakness, no new focal deficits, no tremors   Data Review:   Micro Results Recent Results (from the past 240 hour(s))  Culture, blood (routine x 2)     Status: None (Preliminary result)   Collection Time: 09/12/2019  3:00 PM   Specimen: Left Antecubital; Blood  Result Value Ref Range Status   Specimen Description LEFT ANTECUBITAL  Final   Special Requests   Final    BOTTLES DRAWN AEROBIC AND ANAEROBIC Blood Culture adequate volume   Culture   Final    NO GROWTH < 24 HOURS Performed at [facility], [address] [place], [place] [address]    Report Status PENDING  Incomplete  Respiratory Panel by RT PCR (Flu A&B, Covid) - Nasopharyngeal Swab     Status: None   Collection Time: 08/31/2019  3:32 PM   Specimen: Nasopharyngeal Swab  Result Value Ref Range Status   SARS Coronavirus 2 by RT PCR NEGATIVE NEGATIVE Final    Comment: (NOTE) SARS-CoV-2 target nucleic acids are NOT DETECTED. The SARS-CoV-2 RNA is generally detectable in upper respiratoy specimens during the acute phase of infection. The lowest concentration of  SARS-CoV-2 viral copies this assay can detect is 131 copies/mL. A negative result does not preclude SARS-Cov-2 infection and should not be used as the sole basis for treatment or other patient management decisions. A negative result may occur with  improper specimen collection/handling, submission of specimen other than nasopharyngeal swab, presence of viral mutation(s) within the areas targeted by this assay, and inadequate number of viral copies (<131 copies/mL). A negative result must be combined with clinical observations, patient history, and epidemiological information. The expected result is Negative. Fact Sheet for Patients:  https://www.moore.com/https://www.fda.gov/media/142436/download Fact Sheet for Healthcare Providers:  https://www.Dulay.biz/https://www.fda.gov/media/142435/download This test is not  yet ap proved or cleared by the [place] and  has been authorized for detection and/or diagnosis of SARS-CoV-2 by FDA under an Emergency Use Authorization (EUA). This EUA will remain  in effect (meaning this test can be used) for the duration of the COVID-19 declaration under Section 564(b)(1) of the Act, 21 U.S.C. section 360bbb-3(b)(1), unless the authorization is terminated or revoked sooner.    Influenza A by PCR NEGATIVE NEGATIVE Final   Influenza B by PCR NEGATIVE NEGATIVE Final    Comment: (NOTE) The Xpert Xpress SARS-CoV-2/FLU/RSV assay is intended as an aid in  the diagnosis of influenza from Nasopharyngeal swab specimens and  should not be used as a sole basis for treatment. Nasal washings and  aspirates are unacceptable for Xpert Xpress SARS-CoV-2/FLU/RSV  testing. Fact Sheet for Patients: PinkCheek.be Fact Sheet for Healthcare Providers: GravelBags.it This test is not yet approved or cleared by the [place] FDA and  has been authorized for detection and/or diagnosis of SARS-CoV-2 by  FDA under an Emergency Use Authorization (EUA).  This EUA will remain  in effect (meaning this test can be used) for the duration of the  Covid-19 declaration under Section 564(b)(1) of the Act, 21  U.S.C. section 360bbb-3(b)(1), unless the authorization is  terminated or revoked. Performed at [facility], [address], [place], [place] [address]   Culture, blood (routine x 2)     Status: None (Preliminary result)   Collection Time: 09/11/2019  4:18 PM   Specimen: BLOOD RIGHT HAND  Result Value Ref Range Status   Specimen Description BLOOD RIGHT HAND  Final   Special Requests   Final    BOTTLES DRAWN AEROBIC AND ANAEROBIC Blood Culture adequate volume   Culture   Final    NO GROWTH < 24 HOURS Performed at [facility] [address] [place], [place] [address]    Report Status PENDING  Incomplete  MRSA PCR Screening     Status: None   Collection Time: 09/12/2019  6:59 PM   Specimen: Nasal Mucosa; Nasopharyngeal  Result Value Ref Range Status   MRSA by PCR NEGATIVE NEGATIVE Final    Comment:        The GeneXpert MRSA Assay (FDA approved for NASAL specimens only), is one component of a comprehensive MRSA colonization surveillance program. It is not intended to diagnose MRSA infection nor to guide or monitor treatment for MRSA infections. Performed at [facility], [address] [place], [place] [address]     Radiology Reports US Venous Img Lower Unilateral Left (DVT)  Result Date: 08/28/2019 CLINICAL DATA:  Left leg swelling for 3 days EXAM: LEFT LOWER EXTREMITY VENOUS DOPPLER ULTRASOUND TECHNIQUE: Gray-scale sonography with graded compression, as well as color Doppler and duplex ultrasound were performed to evaluate the lower extremity deep venous systems from the level of the common femoral vein and including the common femoral, femoral, profunda femoral, popliteal and calf veins including the posterior tibial, peroneal and gastrocnemius veins when visible. The superficial great saphenous vein was also interrogated. Spectral  Doppler was utilized to evaluate flow at rest and with distal augmentation maneuvers in the common femoral, femoral and popliteal veins. COMPARISON:  None. FINDINGS: Contralateral Common Femoral Vein: Respiratory phasicity is normal and symmetric with the symptomatic side. No evidence of thrombus. Normal compressibility. Common Femoral Vein: No evidence of thrombus. Normal compressibility, respiratory phasicity and response to augmentation. Saphenofemoral Junction: No evidence of thrombus. Normal compressibility and flow on color Doppler imaging. Profunda Femoral Vein: No  evidence of thrombus. Normal compressibility and flow on color Doppler imaging. Femoral Vein: No evidence of thrombus. Normal compressibility, respiratory phasicity and response to augmentation. Popliteal Vein: No evidence of thrombus. Normal compressibility, respiratory phasicity and response to augmentation. Calf Veins: No evidence of thrombus. Normal compressibility and flow on color Doppler imaging. Superficial Great Saphenous Vein: No evidence of thrombus. Normal compressibility. Venous Reflux:  None. Other Findings:  None. IMPRESSION: No evidence of deep venous thrombosis. Electronically Signed   By: Alcide Clever M.D.   On: 08/28/2019 11:21   DG Chest Port 1 View  Result Date: 08/26/2019 CLINICAL DATA:  Cough for 1 week, hypoxia EXAM: PORTABLE CHEST 1 VIEW COMPARISON:  07/12/2017 FINDINGS: Single frontal view of the chest demonstrates unremarkable cardiac silhouette. Atherosclerosis of the aortic arch. Prominent calcification of the mitral annulus. There is multifocal bilateral airspace disease with relative sparing of the right upper lung zone. No large effusion or pneumothorax. No acute bony abnormalities. IMPRESSION: 1. Multifocal bilateral airspace disease which could reflect pneumonia or edema. Electronically Signed   By: Sharlet Salina M.D.   On: 08/20/2019 15:16   ECHOCARDIOGRAM COMPLETE  Result Date: 08/28/2019     ECHOCARDIOGRAM REPORT   Patient Name:   ALEXIE MANGRUM Date of Exam: 08/28/2019 Medical Rec #:  [id number]    Height:       62.0 in Accession #:    [id number]   Weight:       152.8 lb Date of Birth:  [birth year]    BSA:          1.70 m Patient Age:    89 years     BP:           160/61 mmHg Patient Gender: F            HR:           89 bpm. [facility] Procedure: 2D Echo Indications:    Abnormal ECG 794.31 / R94.31  History:        Patient has no prior history of Echocardiogram examinations.                 Risk Factors:Hypertension. Acute respiratory failure with                 hypoxia , Multifocal pneumonia, Dementia with behavioral                 disturbance.  Sonographer:    Jeryl Columbia RDCS (AE) Referring Phys: [id number] Lorene Klimas IMPRESSIONS  1. Left ventricular ejection fraction, by estimation, is 65 to 70%. The left ventricle has hyperdynamic function. The left ventricle has no regional wall motion abnormalities. Left ventricular diastolic parameters are consistent with Grade I diastolic dysfunction (impaired relaxation). Elevated left atrial pressure.  2. Right ventricular systolic function is normal. The right ventricular size is normal. There is moderately elevated pulmonary artery systolic pressure.  3. The mitral valve is normal in structure and function. No evidence of mitral valve regurgitation. No evidence of mitral stenosis.  4. The aortic valve is tricuspid. Aortic valve regurgitation is not visualized. No aortic stenosis is present.  5. Indeterminate PASP, IVC poorly visualized. PASP is at least 43, suggesting at least moderate pulmonary hypertension. FINDINGS  Left Ventricle: Left ventricular ejection fraction, by estimation, is 65 to 70%. The left ventricle has hyperdynamic function. The left ventricle has no regional wall motion abnormalities. The left ventricular internal cavity size was normal in size. There is no left  ventricular hypertrophy. Left ventricular diastolic  parameters are consistent with Grade I diastolic dysfunction (impaired relaxation). Elevated left atrial pressure. Right Ventricle: The right ventricular size is normal. No increase in right ventricular wall thickness. Right ventricular systolic function is normal. There is moderately elevated pulmonary artery systolic pressure. The tricuspid regurgitant velocity is 3.19 m/s, and with an assumed right atrial pressure of 10 mmHg, the estimated right ventricular systolic pressure is 50.7 mmHg. Left Atrium: Left atrial size was normal in size. Right Atrium: Right atrial size was normal in size. Pericardium: Trivial pericardial effusion is present. The pericardial effusion is circumferential. Mitral Valve: The mitral valve is normal in structure and function. No evidence of mitral valve regurgitation. No evidence of mitral valve stenosis. Tricuspid Valve: The tricuspid valve is normal in structure. Tricuspid valve regurgitation is mild . No evidence of tricuspid stenosis. Aortic Valve: The aortic valve is tricuspid. Aortic valve regurgitation is not visualized. No aortic stenosis is present. Aortic valve mean gradient measures 6.0 mmHg. Aortic valve peak gradient measures 11.6 mmHg. Aortic valve area, by VTI measures 2.03  cm. Pulmonic Valve: The pulmonic valve was not well visualized. Pulmonic valve regurgitation is not visualized. No evidence of pulmonic stenosis. Aorta: The aortic root is normal in size and structure. Pulmonary Artery: Indeterminate PASP, IVC poorly visualized. PASP is at least 43, suggesting at least moderate pulmonary hypertension. Venous: The inferior vena cava was not well visualized. IAS/Shunts: The interatrial septum was not well visualized.  LEFT VENTRICLE PLAX 2D LVIDd:         3.59 cm  Diastology LVIDs:         2.34 cm  LV e' lateral:   6.41 cm/s LV PW:         0.96 cm  LV E/e' lateral: 12.3 LV IVS:        0.94 cm  LV e' medial:    3.99 cm/s LVOT diam:     1.80 cm  LV E/e' medial:  19.8 LV  SV:         71.26 ml LV SV Index:   19.95 LVOT Area:     2.54 cm  RIGHT VENTRICLE RV S prime:     15.30 cm/s TAPSE (M-mode): 2.3 cm LEFT ATRIUM             Index       RIGHT ATRIUM           Index LA diam:        2.90 cm 1.70 cm/m  RA Area:     13.60 cm LA Vol (A2C):   28.6 ml 16.77 ml/m RA Volume:   29.50 ml  17.30 ml/m LA Vol (A4C):   46.1 ml 27.04 ml/m LA Biplane Vol: 39.6 ml 23.23 ml/m  AORTIC VALVE AV Area (Vmax):    1.70 cm AV Area (Vmean):   1.76 cm AV Area (VTI):     2.03 cm AV Vmax:           170.49 cm/s AV Vmean:          117.060 cm/s AV VTI:            0.350 m AV Peak Grad:      11.6 mmHg AV Mean Grad:      6.0 mmHg LVOT Vmax:         113.99 cm/s LVOT Vmean:        81.152 cm/s LVOT VTI:          0.280 m LVOT/AV  VTI ratio: 0.80  AORTA Ao Root diam: 2.40 cm MITRAL VALVE                TRICUSPID VALVE MV Area (PHT): 2.32 cm     TR Peak grad:   40.7 mmHg MV Decel Time: 327 msec     TR Vmax:        319.00 cm/s MV E velocity: 79.10 cm/s MV A velocity: 115.00 cm/s  SHUNTS MV E/A ratio:  0.69         Systemic VTI:  0.28 m                             Systemic Diam: 1.80 cm Dina Rich MD Electronically signed by Dina Rich MD Signature Date/Time: 08/28/2019/3:43:25 PM    Final      CBC Recent Labs  Lab 09/08/2019 1446 08/28/19 0326  WBC 14.8* 8.5  HGB 14.4 13.9  HCT 45.1 43.5  PLT 271 276  MCV 100.0 98.6  MCH 31.9 31.5  MCHC 31.9 32.0  RDW 13.2 13.2  LYMPHSABS 1.5  --   MONOABS 0.6  --   EOSABS 0.0  --   BASOSABS 0.0  --     Chemistries  Recent Labs  Lab 09-08-19 1446 08/28/19 0326  NA 137 141  K 3.8 4.6  CL 105 108  CO2 24 23  GLUCOSE 108* 142*  BUN 28* 20  CREATININE 1.01* 0.79  CALCIUM 9.5 9.0   ------------------------------------------------------------------------------------------------------------------ No results for input(s): CHOL, HDL, LDLCALC, TRIG, CHOLHDL, LDLDIRECT in the last 72 hours.  No results found for:  HGBA1C ------------------------------------------------------------------------------------------------------------------ No results for input(s): TSH, T4TOTAL, T3FREE, THYROIDAB in the last 72 hours.  Invalid input(s): FREET3 ------------------------------------------------------------------------------------------------------------------ No results for input(s): VITAMINB12, FOLATE, FERRITIN, TIBC, IRON, RETICCTPCT in the last 72 hours.  Coagulation profile No results for input(s): INR, PROTIME in the last 168 hours.  No results for input(s): DDIMER in the last 72 hours.  Cardiac Enzymes No results for input(s): CKMB, TROPONINI, MYOGLOBIN in the last 168 hours.  Invalid input(s): CK ------------------------------------------------------------------------------------------------------------------    Component Value Date/Time   BNP 189.0 (H) Sep 08, 2019 1500     Shon Hale M.D on 08/28/2019 at 6:28 PM  Go to [email or web address] - for contact info  [facility] - Office  [phone]

## 2019-08-28 NOTE — Evaluation (Signed)
Clinical/Bedside Swallow Evaluation Patient Details  Name: Jill Mcpherson MRN: [id number] Date of Birth: 1925/03/29  Today's Date: 08/28/2019 Time: SLP Start Time (ACUTE ONLY): 1031 SLP Stop Time (ACUTE ONLY): 1049 SLP Time Calculation (min) (ACUTE ONLY): 18 min  Past Medical History:  Past Medical History:  Diagnosis Date  . COPD (chronic obstructive pulmonary disease) ([place])   . Dementia ([place])   . Hypertension    Past Surgical History:  Past Surgical History:  Procedure Laterality Date  . ABDOMINAL HYSTERECTOMY    . CHOLECYSTECTOMY    . TOTAL HIP ARTHROPLASTY     HPI:  Jill Mcpherson is a 86 y.o. female with medical history significant for COPD, dementia, hypertension.  Patient was brought to the ED by her son- Jill Mcpherson who lives with her.  Patient has been having difficulty breathing and productive cough over the past week.  Patient was started on steroids 3 days ago which she has been compliant with and mostly helped her breathing until yesterday, but the steroids also made her a bit hyperactive.  She was also started on home O2 yesterday at 2 L. She has baseline dementia with memory problems that is unchanged. Patient was started on IV vancomycin and Zosyn.  Hospitalist to admit for pneumonia. CXR reveals: Multifocal bilateral airspace disease which could reflect pneumonia or edema. BSE ordered to evaluate swallowing function.   Assessment / Plan / Recommendation Clinical Impression  Clinical swallowing evaluation completed while Pt was sitting upright in bed and son present at bedside; oral mechanism exam was unremarkable, Pt consumed thin liquids, regular textures and puree without overt s/sx of aspiration. RN reports Pt consistently coughing with meds and breakfast this am; son reports Pt has had a baseline cough for the past two weeks that "comes and goes". Question if Pt was having a coughing episode that was possibly exacerbated by PO trials this am - coughing with and after swallowing  was not observed during this evaluation. Recommend Pt be seated upright for all PO, continue with regular/thin diet; meds are ok whole with liquids. There are no further ST needs noted at this time, ST to sign off.  SLP Visit Diagnosis: Dysphagia, unspecified (R13.10)    Aspiration Risk  Mild aspiration risk    Diet Recommendation Regular;Thin liquid   Liquid Administration via: Cup;Straw Medication Administration: Whole meds with liquid Supervision: Intermittent supervision to cue for compensatory strategies;Patient able to self feed Compensations: Minimize environmental distractions;Slow rate;Small sips/bites Postural Changes: Seated upright at 90 degrees;Remain upright for at least 30 minutes after po intake    Other  Recommendations Oral Care Recommendations: Oral care BID   Follow up Recommendations None        Swallow Study   General Date of Onset: September 12, 2019 HPI: Jill Mcpherson is a 86 y.o. female with medical history significant for COPD, dementia, hypertension.  Patient was brought to the ED by her son- Jill Mcpherson who lives with her.  Patient has been having difficulty breathing and productive cough over the past week.  Patient was started on steroids 3 days ago which she has been compliant with and mostly helped her breathing until yesterday, but the steroids also made her a bit hyperactive.  She was also started on home O2 yesterday at 2 L. She has baseline dementia with memory problems that is unchanged. Patient was started on IV vancomycin and Zosyn.  Hospitalist to admit for pneumonia. CXR reveals: Multifocal bilateral airspace disease which could reflect pneumonia or edema. BSE ordered to evaluate swallowing function.  Type of Study: Bedside Swallow Evaluation Previous Swallow Assessment: none in chart Diet Prior to this Study: Regular;Thin liquids Temperature Spikes Noted: No Respiratory Status: Nasal cannula(HFNC) History of Recent Intubation: No Behavior/Cognition:  Alert;Cooperative;Confused Oral Cavity Assessment: Within Functional Limits Oral Care Completed by SLP: Recent completion by staff Oral Cavity - Dentition: Adequate natural dentition Vision: Functional for self-feeding Self-Feeding Abilities: Able to feed self Patient Positioning: Upright in bed Baseline Vocal Quality: Normal Volitional Cough: Strong Volitional Swallow: Able to elicit    Oral/Motor/Sensory Function Overall Oral Motor/Sensory Function: Within functional limits   Ice Chips Ice chips: Within functional limits   Thin Liquid Thin Liquid: Within functional limits    Nectar Thick Nectar Thick Liquid: Not tested   Honey Thick Honey Thick Liquid: Not tested   Puree Puree: Within functional limits   Solid     Solid: Within functional limits Presentation: Self Fed     Jill Mcpherson, CCC-SLP Speech Language Pathologist  Jill Mcpherson 08/28/2019,11:02 AM

## 2019-08-29 ENCOUNTER — Inpatient Hospital Stay (HOSPITAL_COMMUNITY): Payer: Medicare Other

## 2019-08-29 ENCOUNTER — Inpatient Hospital Stay: Payer: Self-pay

## 2019-08-29 NOTE — Progress Notes (Signed)
After bathing Pt, her sats remain 79% on non-rebreather and 15L HFNC.  Pt yells "no" and cusses when stimulated. Pt has not eaten or drink anything all day despite multiple attempts by son and this RN. MD made aware. Will continue to monitor closely.

## 2019-08-29 NOTE — Progress Notes (Signed)
Patient Demographics:    Hubert Azureannie Bedgood, is a 86 y.o. female, DOB - 1924/12/11, WJX:[id number]  Admit date - 09/05/2019   Admitting Physician Ejiroghene Wendall StadeE Bridey Brookover, MD  Outpatient Primary MD for the patient is Kirstie PeriShah, Ashish, MD  LOS - 2   Chief Complaint  Patient presents with  . Low O2 Sats        Subjective:    Allison Stillion today has no fevers, no emesis,  No chest pain,   -Persistent hypoxemia requiring high flow oxygen, son at bedside -Increased work of breathing  Assessment  & Plan :    Principal Problem:   Acute respiratory failure with hypoxia (HCC) Active Problems:   Multifocal pneumonia   HTN (hypertension)   Chronic heart failure with preserved ejection fraction (HFpEF) (HCC)   Dementia with behavioral disturbance (HCC)  Brief Summary: 86 y.o. female with medical history significant for COPD, dementia, hypertension admitted on 08/17/2019 with acute hypoxic respiratory failure in the setting of multifocal pneumonia with some concerns about CHF as well  A/p 1)Multifocal Pneumonia---worsening hypoxia, worsening respiratory status, worsening dyspnea requiring persistent high flow oxygen nonrebreather bag ---Continue Rocephin/azithromycin along with mucolytics and bronchodilators -Respiratory Status is rather Tenuous,  Pt's condition is guarded--    2)Acute hypoxic respiratory failure--- secondary to #1 above, echo with EF 65 to 70 % with dCHF--manage as above #1  3)Lower Extremity Swelling--- left more than right, tvenous Dopplers negative for DVT, used TED stockings - gentle diuresis  4)HFpEF--- patient with chronic diastolic dysfunction CHF, EF 65 to 70%, with grade 1 diastolic dysfunction--gentle diuresis as ordered  5)Social/Ethics-- code status d/w son at length, Remains Full Code  Disposition/Need for in-Hospital Stay- patient unable to be discharged at this time due to ---  persistent hypoxia, cough and respiratory symptoms--- requiring IV antibiotics,  --Respiratory Status is rather Tenous Pt's condition is guarded-- not medically ready for discharge  -  Code Status : FULL  Family Communication:Discussed with son at bedside  Consults  :  NA  DVT Prophylaxis  :  Lovenox -  Lab Results  Component Value Date   PLT 276 08/28/2019    Inpatient Medications  Scheduled Meds: . Chlorhexidine Gluconate Cloth  6 each Topical Daily  . clopidogrel  75 mg Oral Daily  . enoxaparin (LOVENOX) injection  40 mg Subcutaneous Q24H  . methylPREDNISolone (SOLU-MEDROL) injection  40 mg Intravenous Q12H  . NIFEdipine  30 mg Oral Daily  . rivastigmine  9.5 mg Transdermal Daily   Continuous Infusions: . azithromycin Stopped (08/28/19 2208)  . cefTRIAXone (ROCEPHIN)  IV Stopped (08/29/19 1227)   PRN Meds:.acetaminophen **OR** acetaminophen, ipratropium-albuterol, LORazepam, ondansetron **OR** ondansetron (ZOFRAN) IV, polyethylene glycol    Anti-infectives (From admission, onward)   Start     Dose/Rate Route Frequency Ordered Stop   08/29/19 0700  cefTRIAXone (ROCEPHIN) 2 g in sodium chloride 0.9 % 100 mL IVPB     2 g 200 mL/hr over 30 Minutes Intravenous Every 24 hours 08/28/19 1310     08/28/19 1500  vancomycin (VANCOCIN) IVPB 1000 mg/200 mL premix  Status:  Discontinued     1,000 mg 200 mL/hr over 60 Minutes Intravenous Every 24 hours 09/04/2019 1547 08/22/2019 1928   08/28/19 0700  cefTRIAXone (ROCEPHIN) 1  g in sodium chloride 0.9 % 100 mL IVPB  Status:  Discontinued     1 g 200 mL/hr over 30 Minutes Intravenous Every 24 hours 09/08/2019 1928 08/28/19 1310   09/12/2019 1930  azithromycin (ZITHROMAX) 500 mg in sodium chloride 0.9 % 250 mL IVPB     500 mg 250 mL/hr over 60 Minutes Intravenous Every 24 hours 08/26/2019 1928     09/02/2019 1600  piperacillin-tazobactam (ZOSYN) IVPB 3.375 g  Status:  Discontinued     3.375 g 12.5 mL/hr over 240 Minutes Intravenous Every 8  hours 09/04/2019 1546 09/12/2019 1928   08/23/2019 1545  vancomycin (VANCOREADY) IVPB 1750 mg/350 mL     1,750 mg 175 mL/hr over 120 Minutes Intravenous  Once 09/06/2019 1541 08/31/2019 1812        Objective:   Vitals:   08/29/19 1228 08/29/19 1600 08/29/19 1642 08/29/19 1656  BP:      Pulse: 62 85 71 62  Resp: 20 18 (!) 21 (!) 21  Temp:    97.7 F (36.5 C)  TempSrc:    Axillary  SpO2: 95% 92% (!) 85% (!) 86%  Weight:      Height:        Wt Readings from Last 3 Encounters:  08/28/19 69.3 kg     Intake/Output Summary (Last 24 hours) at 08/29/2019 1806 Last data filed at 08/29/2019 1600 Gross per 24 hour  Intake 353.86 ml  Output 800 ml  Net -446.14 ml    Physical Exam  Gen:-Very lethargic, HEENT:- [place].AT, No sclera icterus Nose- NRB Neck-Supple Neck,No JVD,.  Lungs-scattered rhonchi, few scattered wheezes, diminished breath sounds  CV- S1, S2 normal, regular  Abd-  +ve B.Sounds, Abd Soft, No tenderness,    Extremity/Skin:- No  edema, pedal pulses present  Psych-baseline cognitive, memory deficits with episodes of confusion and disorientation  neuro-generalized weakness, lethargy with episodes of agitation alternating  Data Review:   Micro Results Recent Results (from the past 240 hour(s))  Culture, blood (routine x 2)     Status: None (Preliminary result)   Collection Time: 08/18/2019  3:00 PM   Specimen: Left Antecubital; Blood  Result Value Ref Range Status   Specimen Description LEFT ANTECUBITAL  Final   Special Requests   Final    BOTTLES DRAWN AEROBIC AND ANAEROBIC Blood Culture adequate volume   Culture   Final    NO GROWTH 2 DAYS Performed at [facility] [address], [place], [place] [address]    Report Status PENDING  Incomplete  Respiratory Panel by RT PCR (Flu A&B, Covid) - Nasopharyngeal Swab     Status: None   Collection Time: 09/04/2019  3:32 PM   Specimen: Nasopharyngeal Swab  Result Value Ref Range Status   SARS Coronavirus 2 by RT PCR NEGATIVE  NEGATIVE Final    Comment: (NOTE) SARS-CoV-2 target nucleic acids are NOT DETECTED. The SARS-CoV-2 RNA is generally detectable in upper respiratoy specimens during the acute phase of infection. The lowest concentration of SARS-CoV-2 viral copies this assay can detect is 131 copies/mL. A negative result does not preclude SARS-Cov-2 infection and should not be used as the sole basis for treatment or other patient management decisions. A negative result may occur with  improper specimen collection/handling, submission of specimen other than nasopharyngeal swab, presence of viral mutation(s) within the areas targeted by this assay, and inadequate number of viral copies (<131 copies/mL). A negative result must be combined with clinical observations, patient history, and epidemiological information. The expected  result is Negative. Fact Sheet for Patients:  https://www.moore.com/ Fact Sheet for Healthcare Providers:  https://www.Myrie.biz/ This test is not yet ap proved or cleared by the [place] FDA and  has been authorized for detection and/or diagnosis of SARS-CoV-2 by FDA under an Emergency Use Authorization (EUA). This EUA will remain  in effect (meaning this test can be used) for the duration of the COVID-19 declaration under Section 564(b)(1) of the Act, 21 U.S.C. section 360bbb-3(b)(1), unless the authorization is terminated or revoked sooner.    Influenza A by PCR NEGATIVE NEGATIVE Final   Influenza B by PCR NEGATIVE NEGATIVE Final    Comment: (NOTE) The Xpert Xpress SARS-CoV-2/FLU/RSV assay is intended as an aid in  the diagnosis of influenza from Nasopharyngeal swab specimens and  should not be used as a sole basis for treatment. Nasal washings and  aspirates are unacceptable for Xpert Xpress SARS-CoV-2/FLU/RSV  testing. Fact Sheet for Patients: https://www.moore.com/ Fact Sheet for Healthcare  Providers: https://www.Bowie.biz/ This test is not yet approved or cleared by the [place] FDA and  has been authorized for detection and/or diagnosis of SARS-CoV-2 by  FDA under an Emergency Use Authorization (EUA). This EUA will remain  in effect (meaning this test can be used) for the duration of the  Covid-19 declaration under Section 564(b)(1) of the Act, 21  U.S.C. section 360bbb-3(b)(1), unless the authorization is  terminated or revoked. Performed at [facility], [address] [place], [place] [address]   Culture, blood (routine x 2)     Status: None (Preliminary result)   Collection Time: 08/18/2019  4:18 PM   Specimen: BLOOD RIGHT HAND  Result Value Ref Range Status   Specimen Description BLOOD RIGHT HAND  Final   Special Requests   Final    BOTTLES DRAWN AEROBIC AND ANAEROBIC Blood Culture adequate volume   Culture   Final    NO GROWTH 2 DAYS Performed at [facility], [address] [place], [place] [address]    Report Status PENDING  Incomplete  MRSA PCR Screening     Status: None   Collection Time: 09/06/2019  6:59 PM   Specimen: Nasal Mucosa; Nasopharyngeal  Result Value Ref Range Status   MRSA by PCR NEGATIVE NEGATIVE Final    Comment:        The GeneXpert MRSA Assay (FDA approved for NASAL specimens only), is one component of a comprehensive MRSA colonization surveillance program. It is not intended to diagnose MRSA infection nor to guide or monitor treatment for MRSA infections. Performed at [facility], [address], [place], [place] [address]     Radiology Reports US Venous Img Lower Unilateral Left (DVT)  Result Date: 08/28/2019 CLINICAL DATA:  Left leg swelling for 3 days EXAM: LEFT LOWER EXTREMITY VENOUS DOPPLER ULTRASOUND TECHNIQUE: Gray-scale sonography with graded compression, as well as color Doppler and duplex ultrasound were performed to evaluate the lower extremity deep venous systems from the level of the common  femoral vein and including the common femoral, femoral, profunda femoral, popliteal and calf veins including the posterior tibial, peroneal and gastrocnemius veins when visible. The superficial great saphenous vein was also interrogated. Spectral Doppler was utilized to evaluate flow at rest and with distal augmentation maneuvers in the common femoral, femoral and popliteal veins. COMPARISON:  None. FINDINGS: Contralateral Common Femoral Vein: Respiratory phasicity is normal and symmetric with the symptomatic side. No evidence of thrombus. Normal compressibility. Common Femoral Vein: No evidence of thrombus. Normal compressibility, respiratory phasicity and response to  augmentation. Saphenofemoral Junction: No evidence of thrombus. Normal compressibility and flow on color Doppler imaging. Profunda Femoral Vein: No evidence of thrombus. Normal compressibility and flow on color Doppler imaging. Femoral Vein: No evidence of thrombus. Normal compressibility, respiratory phasicity and response to augmentation. Popliteal Vein: No evidence of thrombus. Normal compressibility, respiratory phasicity and response to augmentation. Calf Veins: No evidence of thrombus. Normal compressibility and flow on color Doppler imaging. Superficial Great Saphenous Vein: No evidence of thrombus. Normal compressibility. Venous Reflux:  None. Other Findings:  None. IMPRESSION: No evidence of deep venous thrombosis. Electronically Signed   By: Inez Catalina M.D.   On: 08/28/2019 11:21   DG CHEST PORT 1 VIEW  Result Date: 08/29/2019 CLINICAL DATA:  Check right-sided PICC line placement EXAM: PORTABLE CHEST 1 VIEW COMPARISON:  08/29/2019 FINDINGS: Right-sided PICC line is now noted with the catheter tip at the cavoatrial junction in satisfactory position. Patchy opacities are again identified and stable. Aortic calcifications are seen. Cardiac shadow is enlarged but stable. Small effusions are noted. IMPRESSION: Right-sided PICC line in  satisfactory position. The remainder of the exam is stable from the prior study. Electronically Signed   By: Inez Catalina M.D.   On: 08/29/2019 12:14   DG CHEST PORT 1 VIEW  Result Date: 08/29/2019 CLINICAL DATA:  Hypoxia. EXAM: PORTABLE CHEST 1 VIEW COMPARISON:  09/08/2019 FINDINGS: There is a moderate left pleural effusion and small right pleural effusion. Multifocal bilateral airspace densities are noted throughout the left lung and right base. When compared with the previous exam aeration to the right base has diminished. IMPRESSION: 1. Bilateral pleural effusions. 2. Multifocal airspace disease compatible with pneumonia with worsening aeration to right base Electronically Signed   By: Kerby Moors M.D.   On: 08/29/2019 05:58   DG Chest Port 1 View  Result Date: 08/31/2019 CLINICAL DATA:  Cough for 1 week, hypoxia EXAM: PORTABLE CHEST 1 VIEW COMPARISON:  07/12/2017 FINDINGS: Single frontal view of the chest demonstrates unremarkable cardiac silhouette. Atherosclerosis of the aortic arch. Prominent calcification of the mitral annulus. There is multifocal bilateral airspace disease with relative sparing of the right upper lung zone. No large effusion or pneumothorax. No acute bony abnormalities. IMPRESSION: 1. Multifocal bilateral airspace disease which could reflect pneumonia or edema. Electronically Signed   By: Randa Ngo M.D.   On: 09/13/2019 15:16   ECHOCARDIOGRAM COMPLETE  Result Date: 08/28/2019    ECHOCARDIOGRAM REPORT   Patient Name:   NIMO VERASTEGUI Date of Exam: 08/28/2019 Medical Rec #:  [id number]    Height:       62.0 in Accession #:    [id number]   Weight:       152.8 lb Date of Birth:  July 20, 1924    BSA:          1.70 m Patient Age:    86 years     BP:           160/61 mmHg Patient Gender: F            HR:           89 bpm. Exam Location:  [facility] Procedure: 2D Echo Indications:    Abnormal ECG 794.31 / R94.31  History:        Patient has no prior history of Echocardiogram  examinations.                 Risk Factors:Hypertension. Acute respiratory failure with  hypoxia , Multifocal pneumonia, Dementia with behavioral                 disturbance.  Sonographer:    Jeryl ColumbiaJohanna Elliott RDCS (AE) Referring Phys: [id number] Sherlie Boyum IMPRESSIONS  1. Left ventricular ejection fraction, by estimation, is 65 to 70%. The left ventricle has hyperdynamic function. The left ventricle has no regional wall motion abnormalities. Left ventricular diastolic parameters are consistent with Grade I diastolic dysfunction (impaired relaxation). Elevated left atrial pressure.  2. Right ventricular systolic function is normal. The right ventricular size is normal. There is moderately elevated pulmonary artery systolic pressure.  3. The mitral valve is normal in structure and function. No evidence of mitral valve regurgitation. No evidence of mitral stenosis.  4. The aortic valve is tricuspid. Aortic valve regurgitation is not visualized. No aortic stenosis is present.  5. Indeterminate PASP, IVC poorly visualized. PASP is at least 43, suggesting at least moderate pulmonary hypertension. FINDINGS  Left Ventricle: Left ventricular ejection fraction, by estimation, is 65 to 70%. The left ventricle has hyperdynamic function. The left ventricle has no regional wall motion abnormalities. The left ventricular internal cavity size was normal in size. There is no left ventricular hypertrophy. Left ventricular diastolic parameters are consistent with Grade I diastolic dysfunction (impaired relaxation). Elevated left atrial pressure. Right Ventricle: The right ventricular size is normal. No increase in right ventricular wall thickness. Right ventricular systolic function is normal. There is moderately elevated pulmonary artery systolic pressure. The tricuspid regurgitant velocity is 3.19 m/s, and with an assumed right atrial pressure of 10 mmHg, the estimated right ventricular systolic pressure is 50.7 mmHg.  Left Atrium: Left atrial size was normal in size. Right Atrium: Right atrial size was normal in size. Pericardium: Trivial pericardial effusion is present. The pericardial effusion is circumferential. Mitral Valve: The mitral valve is normal in structure and function. No evidence of mitral valve regurgitation. No evidence of mitral valve stenosis. Tricuspid Valve: The tricuspid valve is normal in structure. Tricuspid valve regurgitation is mild . No evidence of tricuspid stenosis. Aortic Valve: The aortic valve is tricuspid. Aortic valve regurgitation is not visualized. No aortic stenosis is present. Aortic valve mean gradient measures 6.0 mmHg. Aortic valve peak gradient measures 11.6 mmHg. Aortic valve area, by VTI measures 2.03  cm. Pulmonic Valve: The pulmonic valve was not well visualized. Pulmonic valve regurgitation is not visualized. No evidence of pulmonic stenosis. Aorta: The aortic root is normal in size and structure. Pulmonary Artery: Indeterminate PASP, IVC poorly visualized. PASP is at least 43, suggesting at least moderate pulmonary hypertension. Venous: The inferior vena cava was not well visualized. IAS/Shunts: The interatrial septum was not well visualized.  LEFT VENTRICLE PLAX 2D LVIDd:         3.59 cm  Diastology LVIDs:         2.34 cm  LV e' lateral:   6.41 cm/s LV PW:         0.96 cm  LV E/e' lateral: 12.3 LV IVS:        0.94 cm  LV e' medial:    3.99 cm/s LVOT diam:     1.80 cm  LV E/e' medial:  19.8 LV SV:         71.26 ml LV SV Index:   19.95 LVOT Area:     2.54 cm  RIGHT VENTRICLE RV S prime:     15.30 cm/s TAPSE (M-mode): 2.3 cm LEFT ATRIUM  Index       RIGHT ATRIUM           Index LA diam:        2.90 cm 1.70 cm/m  RA Area:     13.60 cm LA Vol (A2C):   28.6 ml 16.77 ml/m RA Volume:   29.50 ml  17.30 ml/m LA Vol (A4C):   46.1 ml 27.04 ml/m LA Biplane Vol: 39.6 ml 23.23 ml/m  AORTIC VALVE AV Area (Vmax):    1.70 cm AV Area (Vmean):   1.76 cm AV Area (VTI):     2.03 cm  AV Vmax:           170.49 cm/s AV Vmean:          117.060 cm/s AV VTI:            0.350 m AV Peak Grad:      11.6 mmHg AV Mean Grad:      6.0 mmHg LVOT Vmax:         113.99 cm/s LVOT Vmean:        81.152 cm/s LVOT VTI:          0.280 m LVOT/AV VTI ratio: 0.80  AORTA Ao Root diam: 2.40 cm MITRAL VALVE                TRICUSPID VALVE MV Area (PHT): 2.32 cm     TR Peak grad:   40.7 mmHg MV Decel Time: 327 msec     TR Vmax:        319.00 cm/s MV E velocity: 79.10 cm/s MV A velocity: 115.00 cm/s  SHUNTS MV E/A ratio:  0.69         Systemic VTI:  0.28 m                             Systemic Diam: 1.80 cm Dina Rich MD Electronically signed by Dina Rich MD Signature Date/Time: 08/28/2019/3:43:25 PM    Final    [place] EKG SITE RITE  Result Date: 08/29/2019 If Site Rite image not attached, placement could not be confirmed due to current cardiac rhythm.    CBC Recent Labs  Lab 08/29/2019 1446 08/28/19 0326  WBC 14.8* 8.5  HGB 14.4 13.9  HCT 45.1 43.5  PLT 271 276  MCV 100.0 98.6  MCH 31.9 31.5  MCHC 31.9 32.0  RDW 13.2 13.2  LYMPHSABS 1.5  --   MONOABS 0.6  --   EOSABS 0.0  --   BASOSABS 0.0  --     Chemistries  Recent Labs  Lab 09/13/2019 1446 08/28/19 0326  NA 137 141  K 3.8 4.6  CL 105 108  CO2 24 23  GLUCOSE 108* 142*  BUN 28* 20  CREATININE 1.01* 0.79  CALCIUM 9.5 9.0   ------------------------------------------------------------------------------------------------------------------ No results for input(s): CHOL, HDL, LDLCALC, TRIG, CHOLHDL, LDLDIRECT in the last 72 hours.  No results found for: HGBA1C ------------------------------------------------------------------------------------------------------------------ No results for input(s): TSH, T4TOTAL, T3FREE, THYROIDAB in the last 72 hours.  Invalid input(s): FREET3 ------------------------------------------------------------------------------------------------------------------ No results for input(s): VITAMINB12,  FOLATE, FERRITIN, TIBC, IRON, RETICCTPCT in the last 72 hours.  Coagulation profile No results for input(s): INR, PROTIME in the last 168 hours.  No results for input(s): DDIMER in the last 72 hours.  Cardiac Enzymes No results for input(s): CKMB, TROPONINI, MYOGLOBIN in the last 168 hours.  Invalid input(s): CK ------------------------------------------------------------------------------------------------------------------    Component Value Date/Time   BNP  189.0 (H) 09/10/2019 1500     Shon Hale M.D on 08/29/2019 at 6:06 PM  Go to [email or web address] - for contact info  [facility] - Office  [phone]

## 2019-08-29 NOTE — Progress Notes (Signed)
MD spoke with son about code status. Pt's son would like to speak with his brother in [place] before a decision is made. Will continue to monitor pt closely.

## 2019-08-29 NOTE — Progress Notes (Signed)
Patient's O2 saturation consistently 83 on 8L HFNC. Nonrebreather placed, RRT called for breathing treatment, MD aware. Chest xray ordered. O2 87-90 on nonrebreather and HF [place] after breathing treatment.

## 2019-08-29 NOTE — Progress Notes (Signed)
RN states has notified CVW for PICC placement.

## 2019-08-30 ENCOUNTER — Inpatient Hospital Stay (HOSPITAL_COMMUNITY): Payer: Medicare Other

## 2019-08-30 LAB — BASIC METABOLIC PANEL
Anion gap: 8 (ref 5–15)
BUN: 20 mg/dL (ref 8–23)
CO2: 28 mmol/L (ref 22–32)
Calcium: 8.9 mg/dL (ref 8.9–10.3)
Chloride: 104 mmol/L (ref 98–111)
Creatinine, Ser: 0.62 mg/dL (ref 0.44–1.00)
GFR calc Af Amer: >60 mL/min (ref >60)
GFR calc non Af Amer: >60 mL/min (ref >60)
Glucose, Bld: 132 mg/dL — ABNORMAL HIGH (ref 70–99)
Potassium: 3.8 mmol/L (ref 3.5–5.1)
Sodium: 140 mmol/L (ref 135–145)

## 2019-08-30 LAB — BLOOD GAS, ARTERIAL
Acid-Base Excess: 4 mmol/L — ABNORMAL HIGH (ref 0.0–2.0)
Bicarbonate: 27.5 mmol/L (ref 20.0–28.0)
FIO2: 100
O2 Saturation: 94.7 %
Patient temperature: 37
pCO2 arterial: 45.2 mmHg (ref 32.0–48.0)
pH, Arterial: 7.414 (ref 7.350–7.450)
pO2, Arterial: 72.8 mmHg — ABNORMAL LOW (ref 83.0–108.0)

## 2019-08-30 LAB — CBC
HCT: 44.6 % (ref 36.0–46.0)
Hemoglobin: 14.6 g/dL (ref 12.0–15.0)
MCH: 31.8 pg (ref 26.0–34.0)
MCHC: 32.7 g/dL (ref 30.0–36.0)
MCV: 97.2 fL (ref 80.0–100.0)
Platelets: 250 10*3/uL (ref 150–400)
RBC: 4.59 MIL/uL (ref 3.87–5.11)
RDW: 12.9 % (ref 11.5–15.5)
WBC: 12 10*3/uL — ABNORMAL HIGH (ref 4.0–10.5)
nRBC: 0 % (ref 0.0–0.2)

## 2019-08-30 MED ORDER — IPRATROPIUM-ALBUTEROL 0.5-2.5 (3) MG/3ML IN SOLN
3.0000 mL | Freq: Three times a day (TID) | RESPIRATORY_TRACT | Status: DC
  Administered 2019-08-30 – 2019-08-31 (×4): 3 mL via RESPIRATORY_TRACT
  Filled 2019-08-30 (×3): qty 3

## 2019-08-30 MED ORDER — ALBUTEROL SULFATE (2.5 MG/3ML) 0.083% IN NEBU: 2.5000 mg | INHALATION_SOLUTION | RESPIRATORY_TRACT | Status: DC | PRN

## 2019-08-30 MED ORDER — LORAZEPAM 2 MG/ML IJ SOLN
1.0000 mg | INTRAMUSCULAR | Status: DC
  Administered 2019-08-30 – 2019-08-31 (×4): 1 mg via INTRAVENOUS
  Filled 2019-08-30 (×4): qty 1

## 2019-08-30 MED ORDER — FUROSEMIDE 10 MG/ML IJ SOLN
20.0000 mg | Freq: Every day | INTRAMUSCULAR | Status: DC
  Administered 2019-08-30 – 2019-08-31 (×2): 20 mg via INTRAVENOUS
  Filled 2019-08-30 (×2): qty 2

## 2019-08-30 MED ORDER — GUAIFENESIN ER 600 MG PO TB12
600.0000 mg | ORAL_TABLET | Freq: Two times a day (BID) | ORAL | Status: DC
  Administered 2019-08-30: 600 mg via ORAL
  Filled 2019-08-30: qty 1

## 2019-08-30 NOTE — Progress Notes (Signed)
Patient Demographics:    Jill Mcpherson, is a 86 y.o. female, DOB - July 01, 1925, ZOX:[id number]  Admit date - 08/30/2019   Admitting Physician Ejiroghene Arlyce Dice, MD  Outpatient Primary MD for the patient is Monico Blitz, MD  LOS - 3   Chief Complaint  Patient presents with  . Low O2 Sats        Subjective:    Jill Mcpherson today has no fevers, no emesis,  No chest pain,   -Persistent hypoxemia requiring high flow oxygen, son at bedside -Intermittently restless  Assessment  & Plan :    Principal Problem:   Acute respiratory failure with hypoxia (HCC) Active Problems:   Multifocal pneumonia   HTN (hypertension)   Chronic heart failure with preserved ejection fraction (HFpEF) (HCC)   Dementia with behavioral disturbance (HCC)  Brief Summary: 86 y.o. female with medical history significant for COPD, dementia, hypertension admitted on 08/18/2019 with acute hypoxic respiratory failure in the setting of multifocal pneumonia with some concerns about CHF as well  A/p 1)Multifocal Pneumonia--- significant hypoxia persist requiring high flow oxygen  At 15L/min, requiring NRB at times -WBC is 12 in the setting of steroid therapy ---Continue Rocephin/azithromycin along with mucolytics and bronchodilators -Respiratory Status is rather Tenuous,  Pt's condition is guarded--   -Repeat chest x-ray on 08/31/2019  2)Acute hypoxic respiratory failure--- secondary to #1 above, echo with EF 65 to 70 % with dCHF--manage as above #1  3)Lower Extremity Swelling--- left more than right, venous Dopplers negative for DVT, used TED stockings  4)HFpEF--- patient with chronic diastolic dysfunction CHF, EF 65 to 70%, with grade 1 diastolic dysfunction--gentle diuresis as ordered  5)Social/Ethics--Plan of care and code status d/w Both sons (son [name] at bedside and son Legrand Como (by phone in [place]) -Remains Full  Code  Disposition/Need for in-Hospital Stay- patient unable to be discharged at this time due to --- persistent hypoxia, cough and respiratory symptoms--- requiring IV antibiotics and very high flow oxygen  --Respiratory Status is rather Tenous Pt's condition is guarded-- not medically ready for discharge  -  Code Status : FULL  Family Communication:Discussed with son at bedside Plan of care and code status d/w Both sons (son [name] at bedside and son Legrand Como (by phone in [place]) Consults  :  NA  DVT Prophylaxis  :  Lovenox -  Lab Results  Component Value Date   PLT 250 08/30/2019    Inpatient Medications  Scheduled Meds: . Chlorhexidine Gluconate Cloth  6 each Topical Daily  . clopidogrel  75 mg Oral Daily  . enoxaparin (LOVENOX) injection  40 mg Subcutaneous Q24H  . guaiFENesin  600 mg Oral BID  . ipratropium-albuterol  3 mL Nebulization TID  . methylPREDNISolone (SOLU-MEDROL) injection  40 mg Intravenous Q12H  . NIFEdipine  30 mg Oral Daily  . rivastigmine  9.5 mg Transdermal Daily   Continuous Infusions: . azithromycin Stopped (08/29/19 1946)  . cefTRIAXone (ROCEPHIN)  IV 2 g (08/30/19 0943)   PRN Meds:.acetaminophen **OR** acetaminophen, albuterol, LORazepam, ondansetron **OR** ondansetron (ZOFRAN) IV, polyethylene glycol    Anti-infectives (From admission, onward)   Start     Dose/Rate Route Frequency Ordered Stop   08/29/19 0700  cefTRIAXone (ROCEPHIN) 2 g in sodium chloride 0.9 % 100  mL IVPB     2 g 200 mL/hr over 30 Minutes Intravenous Every 24 hours 08/28/19 1310     08/28/19 1500  vancomycin (VANCOCIN) IVPB 1000 mg/200 mL premix  Status:  Discontinued     1,000 mg 200 mL/hr over 60 Minutes Intravenous Every 24 hours 08/21/2019 1547 09/04/2019 1928   08/28/19 0700  cefTRIAXone (ROCEPHIN) 1 g in sodium chloride 0.9 % 100 mL IVPB  Status:  Discontinued     1 g 200 mL/hr over 30 Minutes Intravenous Every 24 hours 08/19/2019 1928 08/28/19 1310   08/19/2019 1930   azithromycin (ZITHROMAX) 500 mg in sodium chloride 0.9 % 250 mL IVPB     500 mg 250 mL/hr over 60 Minutes Intravenous Every 24 hours 08/17/2019 1928     09/13/2019 1600  piperacillin-tazobactam (ZOSYN) IVPB 3.375 g  Status:  Discontinued     3.375 g 12.5 mL/hr over 240 Minutes Intravenous Every 8 hours 08/20/2019 1546 09/08/2019 1928   08/19/2019 1545  vancomycin (VANCOREADY) IVPB 1750 mg/350 mL     1,750 mg 175 mL/hr over 120 Minutes Intravenous  Once 08/26/2019 1541 09/09/2019 1812        Objective:   Vitals:   08/30/19 0800 08/30/19 1027 08/30/19 1122 08/30/19 1145  BP: (!) 169/111 (!) 159/76    Pulse: 67 91 80 76  Resp: 19 (!) 29 20 (!) 21  Temp:   98.1 F (36.7 C)   TempSrc:   Axillary   SpO2: 91% 95% 95% 94%  Weight:      Height:        Wt Readings from Last 3 Encounters:  08/28/19 69.3 kg     Intake/Output Summary (Last 24 hours) at 08/30/2019 1521 Last data filed at 08/30/2019 0500 Gross per 24 hour  Intake 603.86 ml  Output 600 ml  Net 3.86 ml    Physical Exam  Gen:-Very lethargic, HEENT:- [place].AT, No sclera icterus Nose- HFNC 15L/min Neck-Supple Neck,No JVD,.  Lungs-scattered rhonchi, few scattered wheezes, diminished breath sounds  CV- S1, S2 normal, regular  Abd-  +ve B.Sounds, Abd Soft, No tenderness,    Extremity/Skin:-Trace edema, pedal pulses present  Psych-baseline cognitive, memory deficits with episodes of confusion and disorientation  neuro-generalized weakness, lethargy with episodes of agitation alternating  Data Review:   Micro Results Recent Results (from the past 240 hour(s))  Culture, blood (routine x 2)     Status: None (Preliminary result)   Collection Time: 09/04/2019  3:00 PM   Specimen: Left Antecubital; Blood  Result Value Ref Range Status   Specimen Description LEFT ANTECUBITAL  Final   Special Requests   Final    BOTTLES DRAWN AEROBIC AND ANAEROBIC Blood Culture adequate volume   Culture   Final    NO GROWTH 3 DAYS Performed at [facility], [address] [place], [place] [address]    Report Status PENDING  Incomplete  Respiratory Panel by RT PCR (Flu A&B, Covid) - Nasopharyngeal Swab     Status: None   Collection Time: 08/21/2019  3:32 PM   Specimen: Nasopharyngeal Swab  Result Value Ref Range Status   SARS Coronavirus 2 by RT PCR NEGATIVE NEGATIVE Final    Comment: (NOTE) SARS-CoV-2 target nucleic acids are NOT DETECTED. The SARS-CoV-2 RNA is generally detectable in upper respiratoy specimens during the acute phase of infection. The lowest concentration of SARS-CoV-2 viral copies this assay can detect is 131 copies/mL. A negative result does not preclude SARS-Cov-2 infection and should not be used  as the sole basis for treatment or other patient management decisions. A negative result may occur with  improper specimen collection/handling, submission of specimen other than nasopharyngeal swab, presence of viral mutation(s) within the areas targeted by this assay, and inadequate number of viral copies (<131 copies/mL). A negative result must be combined with clinical observations, patient history, and epidemiological information. The expected result is Negative. Fact Sheet for Patients:  https://www.moore.com/https://www.fda.gov/media/142436/download Fact Sheet for Healthcare Providers:  https://www.Anfinson.biz/https://www.fda.gov/media/142435/download This test is not yet ap proved or cleared by the [place] FDA and  has been authorized for detection and/or diagnosis of SARS-CoV-2 by FDA under an Emergency Use Authorization (EUA). This EUA will remain  in effect (meaning this test can be used) for the duration of the COVID-19 declaration under Section 564(b)(1) of the Act, 21 U.S.C. section 360bbb-3(b)(1), unless the authorization is terminated or revoked sooner.    Influenza A by PCR NEGATIVE NEGATIVE Final   Influenza B by PCR NEGATIVE NEGATIVE Final    Comment: (NOTE) The Xpert Xpress SARS-CoV-2/FLU/RSV assay is intended as an aid in  the diagnosis  of influenza from Nasopharyngeal swab specimens and  should not be used as a sole basis for treatment. Nasal washings and  aspirates are unacceptable for Xpert Xpress SARS-CoV-2/FLU/RSV  testing. Fact Sheet for Patients: https://www.moore.com/https://www.fda.gov/media/142436/download Fact Sheet for Healthcare Providers: https://www.Northrup.biz/https://www.fda.gov/media/142435/download This test is not yet approved or cleared by the [place] FDA and  has been authorized for detection and/or diagnosis of SARS-CoV-2 by  FDA under an Emergency Use Authorization (EUA). This EUA will remain  in effect (meaning this test can be used) for the duration of the  Covid-19 declaration under Section 564(b)(1) of the Act, 21  U.S.C. section 360bbb-3(b)(1), unless the authorization is  terminated or revoked. Performed at [facility], [address] [place], [place] [address]   Culture, blood (routine x 2)     Status: None (Preliminary result)   Collection Time: 08/21/2019  4:18 PM   Specimen: BLOOD RIGHT HAND  Result Value Ref Range Status   Specimen Description BLOOD RIGHT HAND  Final   Special Requests   Final    BOTTLES DRAWN AEROBIC AND ANAEROBIC Blood Culture adequate volume   Culture   Final    NO GROWTH 3 DAYS Performed at [facility], [address] [place], [place] [address]    Report Status PENDING  Incomplete  MRSA PCR Screening     Status: None   Collection Time: 09/09/2019  6:59 PM   Specimen: Nasal Mucosa; Nasopharyngeal  Result Value Ref Range Status   MRSA by PCR NEGATIVE NEGATIVE Final    Comment:        The GeneXpert MRSA Assay (FDA approved for NASAL specimens only), is one component of a comprehensive MRSA colonization surveillance program. It is not intended to diagnose MRSA infection nor to guide or monitor treatment for MRSA infections. Performed at [facility], [address] [place], [place] [address]     Radiology Reports US Venous Img Lower Unilateral Left (DVT)  Result Date: 08/28/2019 CLINICAL  DATA:  Left leg swelling for 3 days EXAM: LEFT LOWER EXTREMITY VENOUS DOPPLER ULTRASOUND TECHNIQUE: Gray-scale sonography with graded compression, as well as color Doppler and duplex ultrasound were performed to evaluate the lower extremity deep venous systems from the level of the common femoral vein and including the common femoral, femoral, profunda femoral, popliteal and calf veins including the posterior tibial, peroneal and gastrocnemius veins when visible. The superficial great saphenous vein  was also interrogated. Spectral Doppler was utilized to evaluate flow at rest and with distal augmentation maneuvers in the common femoral, femoral and popliteal veins. COMPARISON:  None. FINDINGS: Contralateral Common Femoral Vein: Respiratory phasicity is normal and symmetric with the symptomatic side. No evidence of thrombus. Normal compressibility. Common Femoral Vein: No evidence of thrombus. Normal compressibility, respiratory phasicity and response to augmentation. Saphenofemoral Junction: No evidence of thrombus. Normal compressibility and flow on color Doppler imaging. Profunda Femoral Vein: No evidence of thrombus. Normal compressibility and flow on color Doppler imaging. Femoral Vein: No evidence of thrombus. Normal compressibility, respiratory phasicity and response to augmentation. Popliteal Vein: No evidence of thrombus. Normal compressibility, respiratory phasicity and response to augmentation. Calf Veins: No evidence of thrombus. Normal compressibility and flow on color Doppler imaging. Superficial Great Saphenous Vein: No evidence of thrombus. Normal compressibility. Venous Reflux:  None. Other Findings:  None. IMPRESSION: No evidence of deep venous thrombosis. Electronically Signed   By: Alcide Clever M.D.   On: 08/28/2019 11:21   DG CHEST PORT 1 VIEW  Result Date: 08/29/2019 CLINICAL DATA:  Check right-sided PICC line placement EXAM: PORTABLE CHEST 1 VIEW COMPARISON:  08/29/2019 FINDINGS:  Right-sided PICC line is now noted with the catheter tip at the cavoatrial junction in satisfactory position. Patchy opacities are again identified and stable. Aortic calcifications are seen. Cardiac shadow is enlarged but stable. Small effusions are noted. IMPRESSION: Right-sided PICC line in satisfactory position. The remainder of the exam is stable from the prior study. Electronically Signed   By: Alcide Clever M.D.   On: 08/29/2019 12:14   DG CHEST PORT 1 VIEW  Result Date: 08/29/2019 CLINICAL DATA:  Hypoxia. EXAM: PORTABLE CHEST 1 VIEW COMPARISON:  09/12/19 FINDINGS: There is a moderate left pleural effusion and small right pleural effusion. Multifocal bilateral airspace densities are noted throughout the left lung and right base. When compared with the previous exam aeration to the right base has diminished. IMPRESSION: 1. Bilateral pleural effusions. 2. Multifocal airspace disease compatible with pneumonia with worsening aeration to right base Electronically Signed   By: Signa Kell M.D.   On: 08/29/2019 05:58   DG Chest Port 1 View  Result Date: 12-Sep-2019 CLINICAL DATA:  Cough for 1 week, hypoxia EXAM: PORTABLE CHEST 1 VIEW COMPARISON:  07/12/2017 FINDINGS: Single frontal view of the chest demonstrates unremarkable cardiac silhouette. Atherosclerosis of the aortic arch. Prominent calcification of the mitral annulus. There is multifocal bilateral airspace disease with relative sparing of the right upper lung zone. No large effusion or pneumothorax. No acute bony abnormalities. IMPRESSION: 1. Multifocal bilateral airspace disease which could reflect pneumonia or edema. Electronically Signed   By: Sharlet Salina M.D.   On: 2019/09/12 15:16   ECHOCARDIOGRAM COMPLETE  Result Date: 08/28/2019    ECHOCARDIOGRAM REPORT   Patient Name:   YITTY ROADS Date of Exam: 08/28/2019 Medical Rec #:  [id number]    Height:       62.0 in Accession #:    [id number]   Weight:       152.8 lb Date of Birth:  12-29-1924     BSA:          1.70 m Patient Age:    89 years     BP:           160/61 mmHg Patient Gender: F            HR:           89 bpm. [facility] Procedure: 2D Echo Indications:    Abnormal ECG 794.31 / R94.31  History:        Patient has no prior history of Echocardiogram examinations.                 Risk Factors:Hypertension. Acute respiratory failure with                 hypoxia , Multifocal pneumonia, Dementia with behavioral                 disturbance.  Sonographer:    Jeryl ColumbiaJohanna Elliott RDCS (AE) Referring Phys: [id number] Liborio Saccente IMPRESSIONS  1. Left ventricular ejection fraction, by estimation, is 65 to 70%. The left ventricle has hyperdynamic function. The left ventricle has no regional wall motion abnormalities. Left ventricular diastolic parameters are consistent with Grade I diastolic dysfunction (impaired relaxation). Elevated left atrial pressure.  2. Right ventricular systolic function is normal. The right ventricular size is normal. There is moderately elevated pulmonary artery systolic pressure.  3. The mitral valve is normal in structure and function. No evidence of mitral valve regurgitation. No evidence of mitral stenosis.  4. The aortic valve is tricuspid. Aortic valve regurgitation is not visualized. No aortic stenosis is present.  5. Indeterminate PASP, IVC poorly visualized. PASP is at least 43, suggesting at least moderate pulmonary hypertension. FINDINGS  Left Ventricle: Left ventricular ejection fraction, by estimation, is 65 to 70%. The left ventricle has hyperdynamic function. The left ventricle has no regional wall motion abnormalities. The left ventricular internal cavity size was normal in size. There is no left ventricular hypertrophy. Left ventricular diastolic parameters are consistent with Grade I diastolic dysfunction (impaired relaxation). Elevated left atrial pressure. Right Ventricle: The right ventricular size is normal. No increase in right ventricular wall  thickness. Right ventricular systolic function is normal. There is moderately elevated pulmonary artery systolic pressure. The tricuspid regurgitant velocity is 3.19 m/s, and with an assumed right atrial pressure of 10 mmHg, the estimated right ventricular systolic pressure is 50.7 mmHg. Left Atrium: Left atrial size was normal in size. Right Atrium: Right atrial size was normal in size. Pericardium: Trivial pericardial effusion is present. The pericardial effusion is circumferential. Mitral Valve: The mitral valve is normal in structure and function. No evidence of mitral valve regurgitation. No evidence of mitral valve stenosis. Tricuspid Valve: The tricuspid valve is normal in structure. Tricuspid valve regurgitation is mild . No evidence of tricuspid stenosis. Aortic Valve: The aortic valve is tricuspid. Aortic valve regurgitation is not visualized. No aortic stenosis is present. Aortic valve mean gradient measures 6.0 mmHg. Aortic valve peak gradient measures 11.6 mmHg. Aortic valve area, by VTI measures 2.03  cm. Pulmonic Valve: The pulmonic valve was not well visualized. Pulmonic valve regurgitation is not visualized. No evidence of pulmonic stenosis. Aorta: The aortic root is normal in size and structure. Pulmonary Artery: Indeterminate PASP, IVC poorly visualized. PASP is at least 43, suggesting at least moderate pulmonary hypertension. Venous: The inferior vena cava was not well visualized. IAS/Shunts: The interatrial septum was not well visualized.  LEFT VENTRICLE PLAX 2D LVIDd:         3.59 cm  Diastology LVIDs:         2.34 cm  LV e' lateral:   6.41 cm/s LV PW:         0.96 cm  LV E/e' lateral: 12.3 LV IVS:        0.94 cm  LV e' medial:  3.99 cm/s LVOT diam:     1.80 cm  LV E/e' medial:  19.8 LV SV:         71.26 ml LV SV Index:   19.95 LVOT Area:     2.54 cm  RIGHT VENTRICLE RV S prime:     15.30 cm/s TAPSE (M-mode): 2.3 cm LEFT ATRIUM             Index       RIGHT ATRIUM           Index LA diam:         2.90 cm 1.70 cm/m  RA Area:     13.60 cm LA Vol (A2C):   28.6 ml 16.77 ml/m RA Volume:   29.50 ml  17.30 ml/m LA Vol (A4C):   46.1 ml 27.04 ml/m LA Biplane Vol: 39.6 ml 23.23 ml/m  AORTIC VALVE AV Area (Vmax):    1.70 cm AV Area (Vmean):   1.76 cm AV Area (VTI):     2.03 cm AV Vmax:           170.49 cm/s AV Vmean:          117.060 cm/s AV VTI:            0.350 m AV Peak Grad:      11.6 mmHg AV Mean Grad:      6.0 mmHg LVOT Vmax:         113.99 cm/s LVOT Vmean:        81.152 cm/s LVOT VTI:          0.280 m LVOT/AV VTI ratio: 0.80  AORTA Ao Root diam: 2.40 cm MITRAL VALVE                TRICUSPID VALVE MV Area (PHT): 2.32 cm     TR Peak grad:   40.7 mmHg MV Decel Time: 327 msec     TR Vmax:        319.00 cm/s MV E velocity: 79.10 cm/s MV A velocity: 115.00 cm/s  SHUNTS MV E/A ratio:  0.69         Systemic VTI:  0.28 m                             Systemic Diam: 1.80 cm Dina Rich MD Electronically signed by Dina Rich MD Signature Date/Time: 08/28/2019/3:43:25 PM    Final    [place] EKG SITE RITE  Result Date: 08/29/2019 If Site Rite image not attached, placement could not be confirmed due to current cardiac rhythm.    CBC Recent Labs  Lab 09/11/2019 1446 08/28/19 0326 08/30/19 0532  WBC 14.8* 8.5 12.0*  HGB 14.4 13.9 14.6  HCT 45.1 43.5 44.6  PLT 271 276 250  MCV 100.0 98.6 97.2  MCH 31.9 31.5 31.8  MCHC 31.9 32.0 32.7  RDW 13.2 13.2 12.9  LYMPHSABS 1.5  --   --   MONOABS 0.6  --   --   EOSABS 0.0  --   --   BASOSABS 0.0  --   --     Chemistries  Recent Labs  Lab 08/28/2019 1446 08/28/19 0326 08/30/19 0532  NA 137 141 140  K 3.8 4.6 3.8  CL 105 108 104  CO2 24 23 28   GLUCOSE 108* 142* 132*  BUN 28* 20 20  CREATININE 1.01* 0.79 0.62  CALCIUM 9.5 9.0 8.9   ------------------------------------------------------------------------------------------------------------------ No results for input(s): CHOL, HDL,  LDLCALC, TRIG, CHOLHDL, LDLDIRECT in the last 72  hours.  No results found for: HGBA1C ------------------------------------------------------------------------------------------------------------------ No results for input(s): TSH, T4TOTAL, T3FREE, THYROIDAB in the last 72 hours.  Invalid input(s): FREET3 ------------------------------------------------------------------------------------------------------------------ No results for input(s): VITAMINB12, FOLATE, FERRITIN, TIBC, IRON, RETICCTPCT in the last 72 hours.  Coagulation profile No results for input(s): INR, PROTIME in the last 168 hours.  No results for input(s): DDIMER in the last 72 hours.  Cardiac Enzymes No results for input(s): CKMB, TROPONINI, MYOGLOBIN in the last 168 hours.  Invalid input(s): CK ------------------------------------------------------------------------------------------------------------------    Component Value Date/Time   BNP 189.0 (H) 08/22/2019 1500     Shon Hale M.D on 08/30/2019 at 3:21 PM  Go to [email or web address] - for contact info  [facility] - Office  [phone]

## 2019-08-30 NOTE — Progress Notes (Signed)
Assisted tele visit to patient with family member.  Dushawn Pusey M, RN  

## 2019-08-30 NOTE — Progress Notes (Signed)
Pt had coughing spell. Family at bedside. O2 sats dropped to the 70's. Pt was on 12L HFNC increased to 15L HFNC and non rebreather placed. Pt sats come up to the mid 70's. MD and RT made aware. Advised to place bipap. Will report to night shift RN.

## 2019-08-30 NOTE — Progress Notes (Signed)
Patient's O2 sustaining around 70% on HF [place] and NRB at 15L each. RRT placed pt on bipap. Ativan provided. Midlevel made aware of patient condition. CXR and ABG ordered. Patient's O2 sat mid 90's on bipap. Will continue to monitor.

## 2019-08-31 ENCOUNTER — Inpatient Hospital Stay (HOSPITAL_COMMUNITY): Payer: Medicare Other

## 2019-08-31 DIAGNOSIS — Z515 Encounter for palliative care: Secondary | ICD-10-CM

## 2019-08-31 DIAGNOSIS — J189 Pneumonia, unspecified organism: Principal | ICD-10-CM

## 2019-08-31 DIAGNOSIS — J9601 Acute respiratory failure with hypoxia: Secondary | ICD-10-CM

## 2019-08-31 LAB — CBC
HCT: 45.1 % (ref 36.0–46.0)
Hemoglobin: 14.7 g/dL (ref 12.0–15.0)
MCH: 31.7 pg (ref 26.0–34.0)
MCHC: 32.6 g/dL (ref 30.0–36.0)
MCV: 97.4 fL (ref 80.0–100.0)
Platelets: 259 10*3/uL (ref 150–400)
RBC: 4.63 MIL/uL (ref 3.87–5.11)
RDW: 13 % (ref 11.5–15.5)
WBC: 13.9 10*3/uL — ABNORMAL HIGH (ref 4.0–10.5)
nRBC: 0 % (ref 0.0–0.2)

## 2019-08-31 LAB — BASIC METABOLIC PANEL
Anion gap: 10 (ref 5–15)
BUN: 25 mg/dL — ABNORMAL HIGH (ref 8–23)
CO2: 28 mmol/L (ref 22–32)
Calcium: 8.9 mg/dL (ref 8.9–10.3)
Chloride: 102 mmol/L (ref 98–111)
Creatinine, Ser: 0.82 mg/dL (ref 0.44–1.00)
GFR calc Af Amer: >60 mL/min (ref >60)
GFR calc non Af Amer: >60 mL/min (ref >60)
Glucose, Bld: 154 mg/dL — ABNORMAL HIGH (ref 70–99)
Potassium: 3.7 mmol/L (ref 3.5–5.1)
Sodium: 140 mmol/L (ref 135–145)

## 2019-08-31 LAB — BLOOD GAS, ARTERIAL
Acid-Base Excess: 6.2 mmol/L — ABNORMAL HIGH (ref 0.0–2.0)
Bicarbonate: 29.4 mmol/L — ABNORMAL HIGH (ref 20.0–28.0)
FIO2: 100
O2 Saturation: 95.3 %
Patient temperature: 36.8
pCO2 arterial: 45.7 mmHg (ref 32.0–48.0)
pH, Arterial: 7.438 (ref 7.350–7.450)
pO2, Arterial: 74.3 mmHg — ABNORMAL LOW (ref 83.0–108.0)

## 2019-08-31 MED ORDER — BISACODYL 10 MG RE SUPP: 10.0000 mg | Freq: Every day | RECTAL | Status: DC | PRN

## 2019-08-31 MED ORDER — HALOPERIDOL LACTATE 5 MG/ML IJ SOLN
5.0000 mg | INTRAMUSCULAR | Status: DC | PRN
  Administered 2019-08-31: 5 mg via INTRAVENOUS
  Filled 2019-08-31: qty 1

## 2019-08-31 MED ORDER — DIPHENHYDRAMINE HCL 50 MG/ML IJ SOLN: 12.5000 mg | INTRAMUSCULAR | Status: DC | PRN

## 2019-08-31 MED ORDER — BIOTENE DRY MOUTH MT LIQD: 15.0000 mL | OROMUCOSAL | Status: DC | PRN

## 2019-08-31 MED ORDER — GLYCOPYRROLATE 0.2 MG/ML IJ SOLN: 0.2000 mg | INTRAMUSCULAR | Status: DC | PRN

## 2019-08-31 MED ORDER — LORAZEPAM 2 MG/ML IJ SOLN
1.0000 mg | INTRAMUSCULAR | Status: DC | PRN
  Administered 2019-09-01: 1 mg via INTRAVENOUS
  Filled 2019-08-31: qty 1

## 2019-08-31 MED ORDER — FENTANYL CITRATE (PF) 100 MCG/2ML IJ SOLN
25.0000 ug | INTRAMUSCULAR | Status: DC | PRN
  Administered 2019-09-01 (×2): 25 ug via INTRAVENOUS
  Filled 2019-08-31 (×2): qty 2

## 2019-08-31 MED ORDER — GLYCOPYRROLATE 1 MG PO TABS: 1.0000 mg | ORAL_TABLET | ORAL | Status: DC | PRN

## 2019-08-31 MED ORDER — POLYVINYL ALCOHOL 1.4 % OP SOLN: 1.0000 [drp] | Freq: Four times a day (QID) | OPHTHALMIC | Status: DC | PRN

## 2019-08-31 NOTE — Progress Notes (Signed)
RT entered room to find patient on NRB and 15LPM [place] high flow [place]. She had a SPO2 of 89-90 on these settings. I administered nebulized Duoneb at this time. After treatment patient kept having periods of desaturation. Night shift had brought the HHFNC to set up if needed. The patient was remaining in the mid 80s after her neb so I placed her on HHFNC of 30lpm and 100% FIO2. SPO2 never got above 85. I increased the flow to 40lpm with no change. I then decided BIPAP was the last option before contacting physician. She was placed on Bipap at 0915 and is tolerating it fairly well. She has relaxed some. I turned her on her right side due to her CXR. Pt is comfortable now with SPO2 97%. RT will make RN aware.

## 2019-08-31 NOTE — Progress Notes (Signed)
Patient very agitated throughout shift, refusing to have blood pressure taken.

## 2019-08-31 NOTE — Progress Notes (Signed)
Family here visiting patient for the day.  All questions answered for son, granddaughter, and niece and updates given throughout day.

## 2019-08-31 NOTE — Progress Notes (Signed)
Patient now comfort care. Family at bedside requesting she be taken off bipap. Placed patient on 15 lpm HFNC per MD order, will titrate as able. Patient currently 96% on that. Bipap pulled out of room.

## 2019-08-31 NOTE — Progress Notes (Signed)
Patient Demographics:    Jill Mcpherson, is a 86 y.o. female, DOB - 03-Jan-1925, ZOX:[id number]  Admit date - 08/29/2019   Admitting Physician Onnie BoerEjiroghene E Imogean Ciampa, MD  Outpatient Primary MD for the patient is Kirstie PeriShah, Ashish, MD  LOS - 4   Chief Complaint  Patient presents with   Low O2 Sats        Subjective:    Jill Mcpherson today has no fevers, no emesis,  No chest pain,   -Persistent hypoxemia requiring high flow oxygen,  Son and niece at bedside --Respiratory status progressively worsened despite aggressive management, failed nasal cannula, failed high flow oxygen, required persistent BiPAP use -PCCM consult appreciated -Family has decided to transition to comfort care at this time  Assessment  & Plan :    Principal Problem:   Acute respiratory failure with hypoxia (HCC) Active Problems:   Multifocal pneumonia   End of life care   HTN (hypertension)   Chronic heart failure with preserved ejection fraction (HFpEF) (HCC)   Dementia with behavioral disturbance [facility](HCC)   Palliative care encounter   Brief Summary: 86 y.o. female with medical history significant for COPD, dementia, hypertension admitted on 08/23/2019 with acute hypoxic respiratory failure in the setting of multifocal pneumonia with some concerns about CHF as well -Respiratory status progressively worsened despite aggressive management, failed nasal cannula, failed high flow oxygen, required persistent BiPAP use -PCCM consult appreciated -Family has decided to transition to comfort care at this time  A/p 1)Multifocal Pneumonia--- treated with steroids and  Rocephin/azithromycin along with mucolytics and bronchodilators -Repeat chest x-ray on 08/31/2019 with mostly left-sided pneumonia/consolidation -Patient consult appreciated -- family request transition to comfort care  2)Acute hypoxic respiratory failure--- secondary to #1  above, echo with EF 65 to 70 % with dCHF-comfort care at this time  3)Lower Extremity Swelling--- left more than right, venous Dopplers negative for DVT, comfort care  4)HFpEF--- patient with chronic diastolic dysfunction CHF, EF 65 to 70%, with grade 1 diastolic dysfunction--comfort care 5)Social/Ethics--Plan of care and code status d/w Both sons (son Jill Mcpherson at bedside and son Jill NeedleMichael (by phone in [place] also discussed with patient niece and caregiver at bedside -Respiratory status progressively worsened despite aggressive management, failed nasal cannula, failed high flow oxygen, required persistent BiPAP use -PCCM consult appreciated -Family has decided to transition to comfort care at this time  Disposition-Family has decided to transition to comfort care at this time, possible transfer to hospice service  Code Status : DNR/DNI/comfort care  Family Communication:Discussed with son at bedside Plan of care and code status d/w Both sons (son Jill Mcpherson at bedside and son Jill NeedleMichael (by phone in [place]), also discussed with niece/caregiver at bedside  Consults  : PCCM  DVT Prophylaxis  : Comfort care  Lab Results  Component Value Date   PLT 259 08/31/2019    Inpatient Medications  Scheduled Meds:  Chlorhexidine Gluconate Cloth  6 each Topical Daily   Continuous Infusions:  PRN Meds:.acetaminophen **OR** acetaminophen, albuterol, antiseptic oral rinse, bisacodyl, diphenhydrAMINE, fentaNYL (SUBLIMAZE) injection, glycopyrrolate **OR** glycopyrrolate **OR** glycopyrrolate, haloperidol lactate, LORazepam, ondansetron **OR** ondansetron (ZOFRAN) IV, polyvinyl alcohol    Anti-infectives (From admission, onward)   Start     Dose/Rate Route Frequency Ordered Stop   08/29/19 0700  cefTRIAXone (ROCEPHIN) 2 g in sodium chloride 0.9 % 100 mL IVPB  Status:  Discontinued     2 g 200 mL/hr over 30 Minutes Intravenous Every 24 hours 08/28/19 1310 08/31/19 1907   08/28/19 1500  vancomycin  (VANCOCIN) IVPB 1000 mg/200 mL premix  Status:  Discontinued     1,000 mg 200 mL/hr over 60 Minutes Intravenous Every 24 hours 08/17/2019 1547 08/26/2019 1928   08/28/19 0700  cefTRIAXone (ROCEPHIN) 1 g in sodium chloride 0.9 % 100 mL IVPB  Status:  Discontinued     1 g 200 mL/hr over 30 Minutes Intravenous Every 24 hours 09/02/2019 1928 08/28/19 1310   08/31/2019 1930  azithromycin (ZITHROMAX) 500 mg in sodium chloride 0.9 % 250 mL IVPB  Status:  Discontinued     500 mg 250 mL/hr over 60 Minutes Intravenous Every 24 hours 08/19/2019 1928 08/31/19 1907   08/20/2019 1600  piperacillin-tazobactam (ZOSYN) IVPB 3.375 g  Status:  Discontinued     3.375 g 12.5 mL/hr over 240 Minutes Intravenous Every 8 hours 09/04/2019 1546 08/17/2019 1928   09/09/2019 1545  vancomycin (VANCOREADY) IVPB 1750 mg/350 mL     1,750 mg 175 mL/hr over 120 Minutes Intravenous  Once 09/09/2019 1541 08/30/2019 1812        Objective:   Vitals:   08/31/19 1606 08/31/19 1608 08/31/19 1630 08/31/19 1700  BP: (!) 128/54     Pulse: 88  77 (!) 104  Resp: (!) 33  (!) 30 (!) 31  Temp: 98.7 F (37.1 C)     TempSrc: Axillary     SpO2: 94% 90% 95% 92%  Weight:      Height:        Wt Readings from Last 3 Encounters:  08/28/19 69.3 kg     Intake/Output Summary (Last 24 hours) at 08/31/2019 1912 Last data filed at 08/31/2019 1700 Gross per 24 hour  Intake 450 ml  Output 900 ml  Net -450 ml    Physical Exam  Gen:-Very lethargic, HEENT:-BiPAP mask to be removed now comfort care Neck-Supple Neck,No JVD,.  Lungs-scattered rhonchi with diminished breath sounds on the left, air movement is much better on the right CV- S1, S2 normal, regular  Abd-  +ve B.Sounds, Abd Soft, No tenderness,    Extremity/Skin:-Trace edema, pedal pulses present  Psych-baseline cognitive, memory deficits with episodes of confusion and disorientation , lethargic neuro-generalized weakness, lethargy with episodes of agitation alternating  Data Review:   Micro  Results Recent Results (from the past 240 hour(s))  Culture, blood (routine x 2)     Status: None (Preliminary result)   Collection Time: 09/11/2019  3:00 PM   Specimen: Left Antecubital; Blood  Result Value Ref Range Status   Specimen Description LEFT ANTECUBITAL  Final   Special Requests   Final    BOTTLES DRAWN AEROBIC AND ANAEROBIC Blood Culture adequate volume   Culture   Final    NO GROWTH 4 DAYS Performed at [facility], [address], [place], [place] [address]    Report Status PENDING  Incomplete  Respiratory Panel by RT PCR (Flu A&B, Covid) - Nasopharyngeal Swab     Status: None   Collection Time: 08/26/2019  3:32 PM   Specimen: Nasopharyngeal Swab  Result Value Ref Range Status   SARS Coronavirus 2 by RT PCR NEGATIVE NEGATIVE Final    Comment: (NOTE) SARS-CoV-2 target nucleic acids are NOT DETECTED. The SARS-CoV-2 RNA is generally detectable in upper respiratoy specimens during the acute phase of  infection. The lowest concentration of SARS-CoV-2 viral copies this assay can detect is 131 copies/mL. A negative result does not preclude SARS-Cov-2 infection and should not be used as the sole basis for treatment or other patient management decisions. A negative result may occur with  improper specimen collection/handling, submission of specimen other than nasopharyngeal swab, presence of viral mutation(s) within the areas targeted by this assay, and inadequate number of viral copies (<131 copies/mL). A negative result must be combined with clinical observations, patient history, and epidemiological information. The expected result is Negative. Fact Sheet for Patients:  https://www.moore.com/ Fact Sheet for Healthcare Providers:  https://www.Natividad.biz/ This test is not yet ap proved or cleared by the [place] FDA and  has been authorized for detection and/or diagnosis of SARS-CoV-2 by FDA under an Emergency Use Authorization (EUA).  This EUA will remain  in effect (meaning this test can be used) for the duration of the COVID-19 declaration under Section 564(b)(1) of the Act, 21 U.S.C. section 360bbb-3(b)(1), unless the authorization is terminated or revoked sooner.    Influenza A by PCR NEGATIVE NEGATIVE Final   Influenza B by PCR NEGATIVE NEGATIVE Final    Comment: (NOTE) The Xpert Xpress SARS-CoV-2/FLU/RSV assay is intended as an aid in  the diagnosis of influenza from Nasopharyngeal swab specimens and  should not be used as a sole basis for treatment. Nasal washings and  aspirates are unacceptable for Xpert Xpress SARS-CoV-2/FLU/RSV  testing. Fact Sheet for Patients: https://www.moore.com/ Fact Sheet for Healthcare Providers: https://www.Maese.biz/ This test is not yet approved or cleared by the [place] FDA and  has been authorized for detection and/or diagnosis of SARS-CoV-2 by  FDA under an Emergency Use Authorization (EUA). This EUA will remain  in effect (meaning this test can be used) for the duration of the  Covid-19 declaration under Section 564(b)(1) of the Act, 21  U.S.C. section 360bbb-3(b)(1), unless the authorization is  terminated or revoked. Performed at [facility], [address], [place], [place] [address]   Culture, blood (routine x 2)     Status: None (Preliminary result)   Collection Time: 08/30/2019  4:18 PM   Specimen: BLOOD RIGHT HAND  Result Value Ref Range Status   Specimen Description BLOOD RIGHT HAND  Final   Special Requests   Final    BOTTLES DRAWN AEROBIC AND ANAEROBIC Blood Culture adequate volume   Culture   Final    NO GROWTH 4 DAYS Performed at [facility], [address], [place], [place] [address]    Report Status PENDING  Incomplete  MRSA PCR Screening     Status: None   Collection Time: 09/07/2019  6:59 PM   Specimen: Nasal Mucosa; Nasopharyngeal  Result Value Ref Range Status   MRSA by PCR NEGATIVE NEGATIVE Final     Comment:        The GeneXpert MRSA Assay (FDA approved for NASAL specimens only), is one component of a comprehensive MRSA colonization surveillance program. It is not intended to diagnose MRSA infection nor to guide or monitor treatment for MRSA infections. Performed at [facility], [address], [place], [place] [address]     Radiology Reports DG Chest 1 View  Result Date: 08/31/2019 CLINICAL DATA:  Dyspnea and respiratory problems EXAM: CHEST  1 VIEW COMPARISON:  Yesterday FINDINGS: Worsening retrocardiac aeration with obscured diaphragm. Improved right upper lobe aeration. Stable cardiomegaly. Unremarkable right PICC with tip at the upper cavoatrial junction. IMPRESSION: Worsened left lower lobe and improved right upper lobe aeration.  Electronically Signed   By: Marnee Spring M.D.   On: 08/31/2019 06:29   US Venous Img Lower Unilateral Left (DVT)  Result Date: 08/28/2019 CLINICAL DATA:  Left leg swelling for 3 days EXAM: LEFT LOWER EXTREMITY VENOUS DOPPLER ULTRASOUND TECHNIQUE: Gray-scale sonography with graded compression, as well as color Doppler and duplex ultrasound were performed to evaluate the lower extremity deep venous systems from the level of the common femoral vein and including the common femoral, femoral, profunda femoral, popliteal and calf veins including the posterior tibial, peroneal and gastrocnemius veins when visible. The superficial great saphenous vein was also interrogated. Spectral Doppler was utilized to evaluate flow at rest and with distal augmentation maneuvers in the common femoral, femoral and popliteal veins. COMPARISON:  None. FINDINGS: Contralateral Common Femoral Vein: Respiratory phasicity is normal and symmetric with the symptomatic side. No evidence of thrombus. Normal compressibility. Common Femoral Vein: No evidence of thrombus. Normal compressibility, respiratory phasicity and response to augmentation. Saphenofemoral Junction: No evidence of  thrombus. Normal compressibility and flow on color Doppler imaging. Profunda Femoral Vein: No evidence of thrombus. Normal compressibility and flow on color Doppler imaging. Femoral Vein: No evidence of thrombus. Normal compressibility, respiratory phasicity and response to augmentation. Popliteal Vein: No evidence of thrombus. Normal compressibility, respiratory phasicity and response to augmentation. Calf Veins: No evidence of thrombus. Normal compressibility and flow on color Doppler imaging. Superficial Great Saphenous Vein: No evidence of thrombus. Normal compressibility. Venous Reflux:  None. Other Findings:  None. IMPRESSION: No evidence of deep venous thrombosis. Electronically Signed   By: Alcide Clever M.D.   On: 08/28/2019 11:21   DG CHEST PORT 1 VIEW  Result Date: 08/30/2019 CLINICAL DATA:  Difficulty breathing EXAM: PORTABLE CHEST 1 VIEW COMPARISON:  08/29/2019 FINDINGS: Single frontal view of the chest demonstrates right-sided PICC unchanged. Cardiac silhouette is stable. Extensive atherosclerosis of the coronary vessels. Patchy bilateral airspace disease has progressed slightly, with increased consolidation in the bilateral perihilar regions. Small left pleural effusion has developed. No pneumothorax. IMPRESSION: 1. Progressive bilateral airspace disease. 2. Small left pleural effusion. Electronically Signed   By: Sharlet Salina M.D.   On: 08/30/2019 20:17   DG CHEST PORT 1 VIEW  Result Date: 08/29/2019 CLINICAL DATA:  Check right-sided PICC line placement EXAM: PORTABLE CHEST 1 VIEW COMPARISON:  08/29/2019 FINDINGS: Right-sided PICC line is now noted with the catheter tip at the cavoatrial junction in satisfactory position. Patchy opacities are again identified and stable. Aortic calcifications are seen. Cardiac shadow is enlarged but stable. Small effusions are noted. IMPRESSION: Right-sided PICC line in satisfactory position. The remainder of the exam is stable from the prior study.  Electronically Signed   By: Alcide Clever M.D.   On: 08/29/2019 12:14   DG CHEST PORT 1 VIEW  Result Date: 08/29/2019 CLINICAL DATA:  Hypoxia. EXAM: PORTABLE CHEST 1 VIEW COMPARISON:  08/26/2019 FINDINGS: There is a moderate left pleural effusion and small right pleural effusion. Multifocal bilateral airspace densities are noted throughout the left lung and right base. When compared with the previous exam aeration to the right base has diminished. IMPRESSION: 1. Bilateral pleural effusions. 2. Multifocal airspace disease compatible with pneumonia with worsening aeration to right base Electronically Signed   By: Signa Kell M.D.   On: 08/29/2019 05:58   DG Chest Port 1 View  Result Date: 08/24/2019 CLINICAL DATA:  Cough for 1 week, hypoxia EXAM: PORTABLE CHEST 1 VIEW COMPARISON:  07/12/2017 FINDINGS: Single frontal view of the chest demonstrates unremarkable cardiac  silhouette. Atherosclerosis of the aortic arch. Prominent calcification of the mitral annulus. There is multifocal bilateral airspace disease with relative sparing of the right upper lung zone. No large effusion or pneumothorax. No acute bony abnormalities. IMPRESSION: 1. Multifocal bilateral airspace disease which could reflect pneumonia or edema. Electronically Signed   By: Sharlet Salina M.D.   On: 09-11-19 15:16   ECHOCARDIOGRAM COMPLETE  Result Date: 08/28/2019    ECHOCARDIOGRAM REPORT   Patient Name:   Jill Mcpherson Date of Exam: 08/28/2019 Medical Rec #:  [id number]    Height:       62.0 in Accession #:    [id number]   Weight:       152.8 lb Date of Birth:  November 06, 1924    BSA:          1.70 m Patient Age:    86 years     BP:           160/61 mmHg Patient Gender: F            HR:           89 bpm. [facility] Procedure: 2D Echo Indications:    Abnormal ECG 794.31 / R94.31  History:        Patient has no prior history of Echocardiogram examinations.                 Risk Factors:Hypertension. Acute respiratory failure with                  hypoxia , Multifocal pneumonia, Dementia with behavioral                 disturbance.  Sonographer:    Jeryl Columbia RDCS (AE) Referring Phys: [id number] Tequila Rottmann IMPRESSIONS  1. Left ventricular ejection fraction, by estimation, is 65 to 70%. The left ventricle has hyperdynamic function. The left ventricle has no regional wall motion abnormalities. Left ventricular diastolic parameters are consistent with Grade I diastolic dysfunction (impaired relaxation). Elevated left atrial pressure.  2. Right ventricular systolic function is normal. The right ventricular size is normal. There is moderately elevated pulmonary artery systolic pressure.  3. The mitral valve is normal in structure and function. No evidence of mitral valve regurgitation. No evidence of mitral stenosis.  4. The aortic valve is tricuspid. Aortic valve regurgitation is not visualized. No aortic stenosis is present.  5. Indeterminate PASP, IVC poorly visualized. PASP is at least 43, suggesting at least moderate pulmonary hypertension. FINDINGS  Left Ventricle: Left ventricular ejection fraction, by estimation, is 65 to 70%. The left ventricle has hyperdynamic function. The left ventricle has no regional wall motion abnormalities. The left ventricular internal cavity size was normal in size. There is no left ventricular hypertrophy. Left ventricular diastolic parameters are consistent with Grade I diastolic dysfunction (impaired relaxation). Elevated left atrial pressure. Right Ventricle: The right ventricular size is normal. No increase in right ventricular wall thickness. Right ventricular systolic function is normal. There is moderately elevated pulmonary artery systolic pressure. The tricuspid regurgitant velocity is 3.19 m/s, and with an assumed right atrial pressure of 10 mmHg, the estimated right ventricular systolic pressure is 50.7 mmHg. Left Atrium: Left atrial size was normal in size. Right Atrium: Right atrial size was  normal in size. Pericardium: Trivial pericardial effusion is present. The pericardial effusion is circumferential. Mitral Valve: The mitral valve is normal in structure and function. No evidence of mitral valve regurgitation. No evidence of mitral valve stenosis. Tricuspid Valve:  The tricuspid valve is normal in structure. Tricuspid valve regurgitation is mild . No evidence of tricuspid stenosis. Aortic Valve: The aortic valve is tricuspid. Aortic valve regurgitation is not visualized. No aortic stenosis is present. Aortic valve mean gradient measures 6.0 mmHg. Aortic valve peak gradient measures 11.6 mmHg. Aortic valve area, by VTI measures 2.03  cm. Pulmonic Valve: The pulmonic valve was not well visualized. Pulmonic valve regurgitation is not visualized. No evidence of pulmonic stenosis. Aorta: The aortic root is normal in size and structure. Pulmonary Artery: Indeterminate PASP, IVC poorly visualized. PASP is at least 43, suggesting at least moderate pulmonary hypertension. Venous: The inferior vena cava was not well visualized. IAS/Shunts: The interatrial septum was not well visualized.  LEFT VENTRICLE PLAX 2D LVIDd:         3.59 cm  Diastology LVIDs:         2.34 cm  LV e' lateral:   6.41 cm/s LV PW:         0.96 cm  LV E/e' lateral: 12.3 LV IVS:        0.94 cm  LV e' medial:    3.99 cm/s LVOT diam:     1.80 cm  LV E/e' medial:  19.8 LV SV:         71.26 ml LV SV Index:   19.95 LVOT Area:     2.54 cm  RIGHT VENTRICLE RV S prime:     15.30 cm/s TAPSE (M-mode): 2.3 cm LEFT ATRIUM             Index       RIGHT ATRIUM           Index LA diam:        2.90 cm 1.70 cm/m  RA Area:     13.60 cm LA Vol (A2C):   28.6 ml 16.77 ml/m RA Volume:   29.50 ml  17.30 ml/m LA Vol (A4C):   46.1 ml 27.04 ml/m LA Biplane Vol: 39.6 ml 23.23 ml/m  AORTIC VALVE AV Area (Vmax):    1.70 cm AV Area (Vmean):   1.76 cm AV Area (VTI):     2.03 cm AV Vmax:           170.49 cm/s AV Vmean:          117.060 cm/s AV VTI:             0.350 m AV Peak Grad:      11.6 mmHg AV Mean Grad:      6.0 mmHg LVOT Vmax:         113.99 cm/s LVOT Vmean:        81.152 cm/s LVOT VTI:          0.280 m LVOT/AV VTI ratio: 0.80  AORTA Ao Root diam: 2.40 cm MITRAL VALVE                TRICUSPID VALVE MV Area (PHT): 2.32 cm     TR Peak grad:   40.7 mmHg MV Decel Time: 327 msec     TR Vmax:        319.00 cm/s MV E velocity: 79.10 cm/s MV A velocity: 115.00 cm/s  SHUNTS MV E/A ratio:  0.69         Systemic VTI:  0.28 m                             Systemic Diam: 1.80 cm NIKE  MD Electronically signed by Dina RichJonathan Branch MD Signature Date/Time: 08/28/2019/3:43:25 PM    Final    US EKG SITE RITE  Result Date: 08/29/2019 If Site Rite image not attached, placement could not be confirmed due to current cardiac rhythm.    CBC Recent Labs  Lab 2020/04/16 1446 08/28/19 0326 08/30/19 0532 08/31/19 0420  WBC 14.8* 8.5 12.0* 13.9*  HGB 14.4 13.9 14.6 14.7  HCT 45.1 43.5 44.6 45.1  PLT 271 276 250 259  MCV 100.0 98.6 97.2 97.4  MCH 31.9 31.5 31.8 31.7  MCHC 31.9 32.0 32.7 32.6  RDW 13.2 13.2 12.9 13.0  LYMPHSABS 1.5  --   --   --   MONOABS 0.6  --   --   --   EOSABS 0.0  --   --   --   BASOSABS 0.0  --   --   --     Chemistries  Recent Labs  Lab 2020/04/16 1446 08/28/19 0326 08/30/19 0532 08/31/19 0420  NA 137 141 140 140  K 3.8 4.6 3.8 3.7  CL 105 108 104 102  CO2 24 23 28 28   GLUCOSE 108* 142* 132* 154*  BUN 28* 20 20 25*  CREATININE 1.01* 0.79 0.62 0.82  CALCIUM 9.5 9.0 8.9 8.9   ------------------------------------------------------------------------------------------------------------------ No results for input(s): CHOL, HDL, LDLCALC, TRIG, CHOLHDL, LDLDIRECT in the last 72 hours.  No results found for: HGBA1C ------------------------------------------------------------------------------------------------------------------ No results for input(s): TSH, T4TOTAL, T3FREE, THYROIDAB in the last 72 hours.  Invalid input(s):  FREET3 ------------------------------------------------------------------------------------------------------------------ No results for input(s): VITAMINB12, FOLATE, FERRITIN, TIBC, IRON, RETICCTPCT in the last 72 hours.  Coagulation profile No results for input(s): INR, PROTIME in the last 168 hours.  No results for input(s): DDIMER in the last 72 hours.  Cardiac Enzymes No results for input(s): CKMB, TROPONINI, MYOGLOBIN in the last 168 hours.  Invalid input(s): CK ------------------------------------------------------------------------------------------------------------------    Component Value Date/Time   BNP 189.0 (H) 2020-01-11 1500    Shon Haleourage Tedric Leeth M.D on 08/31/2019 at 7:12 PM  Go to [email or web address] - for contact info  [facility] - Office  [phone]

## 2019-08-31 NOTE — Consult Note (Signed)
Name: Suni Jarnagin MRN: [id number] DOB: 1925-02-06    ADMISSION DATE:  09/07/2019 CONSULTATION DATE:  08/31/2019  REFERRING MD :  Marisa Severin, MD  CHIEF COMPLAINT:  resp distress nine  BRIEF PATIENT DESCRIPTION: 86 year old woman with dementia and "COPD", admitted with community-acquired pneumonia and other acute respiratory failure requiring BiPAP  SIGNIFICANT EVENTS  2/11 admit 2/15 BiPAP  STUDIES:  Echo 2/12 RVSP 43, normal LV function   HISTORY OF PRESENT ILLNESS: 86 year old woman with dementia and hypertension admitted 2/11 with difficulty breathing and productive cough.  She was started on oxygen by her primary pulmonologist.  She was found to be hypoxic and quickly progressed from nasal cannula to nonrebreather mask.  Over the last few days she has transition from high flow nasal cannula to nonrebreather this morning and eventually was placed on BiPAP.  She was combative on the BiPAP and was given Ativan for sedation, PCCM consulted.  She was being treated with ceftriaxone and azithromycin I have reviewed hospitalist notes and her serial imaging studies.  Admission chest x-ray 2/11 shows multifocal bilateral airspace disease, and both lower lobes, there is relative sparing of right upper lobe.  Over the next few days right-sided infiltrate seem to improve but the left lower lobe consolidation appears dense and chest x-ray from 2/15 shows improved right upper lobe aeration and worsening left lower lobe/retrocardiac infiltrate with obscured left hemidiaphragm  Admission labs showed leukocytosis with a left shift  PAST MEDICAL HISTORY :   has a past medical history of COPD (chronic obstructive pulmonary disease) (HCC), Dementia (HCC), and Hypertension.  has a past surgical history that includes Cholecystectomy; Abdominal hysterectomy; and Total hip arthroplasty. Prior to Admission medications   Medication Sig Start Date End Date Taking? Authorizing Provider  acetaminophen (TYLENOL)  500 MG tablet Take 500 mg by mouth daily as needed.   Yes [provider]  ADVAIR DISKUS 250-50 MCG/DOSE AEPB Inhale 1 puff into the lungs 2 (two) times daily.  06/16/19  Yes [provider]  albuterol (PROVENTIL) (2.5 MG/3ML) 0.083% nebulizer solution Take 2.5 mg by nebulization every 6 (six) hours as needed for wheezing or shortness of breath.  07/26/19  Yes [provider]  azithromycin (ZITHROMAX) 250 MG tablet Take 250-500 mg by mouth See admin instructions. Starting on 08/25/2019 take 500mg  on day 1 then take 250mg  on days 2 through 5.   Yes [provider]  Cholecalciferol 25 MCG (1000 UT) capsule Take 1,000 Units by mouth daily.    Yes [provider]  clopidogrel (PLAVIX) 75 MG tablet Take 75 mg by mouth daily.    Yes [provider]  loratadine (CLARITIN) 10 MG tablet Take 10 mg by mouth daily.   Yes [provider]  NIFEdipine (PROCARDIA-XL/NIFEDICAL-XL) 30 MG 24 hr tablet Take 30 mg by mouth daily. 06/26/19  Yes [provider]  predniSONE (DELTASONE) 10 MG tablet Take 10 mg by mouth See admin instructions. Starting on 08/25/2019 on tapered course take 10mg  three times daily for 3 days, then take 10mg  twice daily for 3 days, then take 10mg  once daily for 1 day, then STOP.   Yes [provider]  rivastigmine (EXELON) 9.5 mg/24hr Place 1 patch onto the skin daily.   Yes [provider]  umeclidinium bromide (INCRUSE ELLIPTA) 62.5 MCG/INH AEPB Inhale 1 puff into the lungs daily.    Yes [provider]   Allergies  Allergen Reactions  . Amoxil [Amoxicillin] Nausea And Vomiting  . Asa [Aspirin]  Unknown reaction  . Morphine And Related     Unknown reaction    FAMILY HISTORY:  family history is not on file. SOCIAL HISTORY:    REVIEW OF SYSTEMS:   Unable to obtain since patient is sedated  SUBJECTIVE:   VITAL SIGNS: Temp:  [98.2 F (36.8 C)-99.1 F (37.3 C)] 98.2 F (36.8 C) (02/15  1243) Pulse Rate:  [62-96] 88 (02/15 1606) Resp:  [21-38] 28 (02/15 1500) BP: (122-168)/(51-96) 128/54 (02/15 1606) SpO2:  [70 %-100 %] 90 % (02/15 1608) FiO2 (%):  [60 %-100 %] 60 % (02/15 1332)  PHYSICAL EXAMINATION: General: Elderly woman, on BiPAP, full facemask, mild distress Neuro: Intermittent agitation, moving all four extremities, sedated with Ativan HEENT: Right cataract, no JVD, no edema Cardiovascular: S1-S2 regular with PVCs Lungs: Faint rhonchi, decreased breath sounds on left Abdomen: Soft, nontender abdomen Musculoskeletal: No deformity Skin: No ecchymosis or petechia, no rash  Recent Labs  Lab 08/28/19 0326 08/30/19 0532 08/31/19 0420  NA 141 140 140  K 4.6 3.8 3.7  CL 108 104 102  CO2 23 28 28   BUN 20 20 25*  CREATININE 0.79 0.62 0.82  GLUCOSE 142* 132* 154*   Recent Labs  Lab 08/28/19 0326 08/30/19 0532 08/31/19 0420  HGB 13.9 14.6 14.7  HCT 43.5 44.6 45.1  WBC 8.5 12.0* 13.9*  PLT 276 250 259   DG Chest 1 View  Result Date: 08/31/2019 CLINICAL DATA:  Dyspnea and respiratory problems EXAM: CHEST  1 VIEW COMPARISON:  Yesterday FINDINGS: Worsening retrocardiac aeration with obscured diaphragm. Improved right upper lobe aeration. Stable cardiomegaly. Unremarkable right PICC with tip at the upper cavoatrial junction. IMPRESSION: Worsened left lower lobe and improved right upper lobe aeration. Electronically Signed   By: Monte Fantasia M.D.   On: 08/31/2019 06:29   DG CHEST PORT 1 VIEW  Result Date: 08/30/2019 CLINICAL DATA:  Difficulty breathing EXAM: PORTABLE CHEST 1 VIEW COMPARISON:  08/29/2019 FINDINGS: Single frontal view of the chest demonstrates right-sided PICC unchanged. Cardiac silhouette is stable. Extensive atherosclerosis of the coronary vessels. Patchy bilateral airspace disease has progressed slightly, with increased consolidation in the bilateral perihilar regions. Small left pleural effusion has developed. No pneumothorax. IMPRESSION: 1.  Progressive bilateral airspace disease. 2. Small left pleural effusion. Electronically Signed   By: Randa Ngo M.D.   On: 08/30/2019 20:17    ASSESSMENT / PLAN:  Community-acquired pneumonia versus aspiration   -repeat chest x-ray from 2/15 suggest left lower lobe consolidation versus atelectasis  Acute hypoxic respiratory failure Doubt significant COPD but she was on Advair at some point by her primary pulmonologist Dr. Koleen Nimrod  Recommend -Detailed discussion with niece was at the bedside regarding life support measures , her son already later called back and confirmed DNR status -no intubation -Continue BiPAP 18/8 with full facemask, decrease FiO2 as saturations permit unfortunately she will need some level of sedation to leave BiPAP on-would prefer to use Haldol rather than Ativan in this woman with dementia  -If this fails and it comes to a point where BiPAP is causing more discomfort than helping, then would suggest changing to nonrebreather and progressing to full comfort if son is in agreement  -Continue ceftriaxone and azithromycin, obtain urine strep antigen for completion   Kara Mead MD. FCCP. [facility]  If no response to pager , please call [phone]     08/31/2019, 4:15 PM

## 2019-08-31 NOTE — Progress Notes (Signed)
Palliative-   Consult received, chart reviewed.   Called patient's son Ardelle Balls- plan made to meet at the hospital tomorrow morning at 10am.   Thank you for this consult.   Ocie Bob, AGNP-C Palliative Medicine  Please call Palliative Medicine team phone with any questions [phone]. For individual providers please see AMION.  No charge

## 2019-09-01 DIAGNOSIS — J189 Pneumonia, unspecified organism: Secondary | ICD-10-CM

## 2019-09-01 DIAGNOSIS — Z7189 Other specified counseling: Secondary | ICD-10-CM

## 2019-09-01 DIAGNOSIS — J42 Unspecified chronic bronchitis: Secondary | ICD-10-CM

## 2019-09-01 DIAGNOSIS — Z515 Encounter for palliative care: Secondary | ICD-10-CM

## 2019-09-01 LAB — CULTURE, BLOOD (ROUTINE X 2)
Culture: NO GROWTH
Culture: NO GROWTH
Special Requests: ADEQUATE
Special Requests: ADEQUATE

## 2019-09-01 MED ORDER — LORAZEPAM 2 MG/ML IJ SOLN
2.0000 mg | Freq: Once | INTRAMUSCULAR | Status: AC
  Administered 2019-09-01: 2 mg via INTRAVENOUS

## 2019-09-01 MED ORDER — LORAZEPAM 2 MG/ML IJ SOLN
2.0000 mg | INTRAMUSCULAR | Status: DC
  Filled 2019-09-01: qty 1

## 2019-09-01 MED ORDER — FENTANYL BOLUS VIA INFUSION
50.0000 ug | INTRAVENOUS | Status: DC | PRN
  Administered 2019-09-01: 50 ug via INTRAVENOUS
  Filled 2019-09-01: qty 50

## 2019-09-01 MED ORDER — HYDROMORPHONE HCL 1 MG/ML IJ SOLN
1.0000 mg | INTRAMUSCULAR | Status: DC | PRN
  Administered 2019-09-01: 1 mg via INTRAVENOUS
  Filled 2019-09-01: qty 1

## 2019-09-01 MED ORDER — SODIUM CHLORIDE 0.9 % IV SOLN
0.0000 ug/h | INTRAVENOUS | Status: DC
  Administered 2019-09-01: 12:00:00 20 ug/h via INTRAVENOUS
  Filled 2019-09-01 (×2): qty 50

## 2019-09-01 MED ORDER — LORAZEPAM 2 MG/ML IJ SOLN: 2.0000 mg | INTRAMUSCULAR | Status: DC | PRN

## 2019-09-01 MED ORDER — FENTANYL 2500MCG IN NS 250ML (10MCG/ML) PREMIX INFUSION: 0.0000 ug/h | INTRAVENOUS | Status: DC

## 2019-09-14 NOTE — Consult Note (Signed)
Consultation Note Date: 08/29/2019   Patient Name: Jill Mcpherson  DOB: 04/09/1925  MRN: [id number]  Age / Sex: 86 y.o., female  PCP: Monico Blitz, MD Referring Physician: Roxan Hockey, MD  Reason for Consultation: Establishing goals of care  HPI/Patient Profile: 86 y.o. female  with past medical history of COPD, demmentia, history of CVA, HTN, admitted on 08/31/2019 with acute respiratory failure d/t multifocal pneumonia. During admission despite aggressive care she has continued to have decline in respiratory status. Per discussion by attending MD with family- patient transitioned to comfort care and made DNR. Palliative medicine consult for continued family support and symptom management.    Clinical Assessment and Goals of Care: Reviewed chart and evaluated patient at bedside. Her son, granddaughter and niece were at bedside. Patient appeared distressed on high flow nasal cannula. Seems she is aspirating constantly. RR was increased, work of breathing increased and saturations were dropping into low 60's. Patient was moaning, stating, "make me go, take me"  Discussed goals of care with family. Chaplain Joya Gaskins was also kindly present.  Patient's son- Jill Mcpherson is visibly distressed and admittedly can not make decisions regarding her care- this morning he delayed comfort medication for patient.  He defers all discussion to his daughter Jill Mcpherson.We discussed taking patient home with Hospice support- but I do not feel her symptoms can be managed at home and I believe there is high risk that she would die during transport. I discussed with them option for more aggressive symptom management. Discussed when patient becomes unresponsive- would be appropriate to titrate oxygen down.  After witnessing another period in which patient became distressed- family in agreement for more aggressive symptom management and comfort care.    Primary Decision Maker NEXT OF KIN- patient's son- Jill Mcpherson with support of Granddaughter Jill Mcpherson    SUMMARY OF RECOMMENDATIONS -Comfort measures only -Initiate fentanyl IV infusions at 32mcg/hr with 40mcg bolus q76min prn for SOB or distress -Lorazepam 2mg  q6hr IV scheduled -When patient appears comfortable and it is apparent that oxygen is no longer a factor in her comfort- recommend titrating O2 down -No not titrate oxygen up- give fentanyl bolus or lorazepam for comfort    Code Status/Advance Care Planning:  DNR  Palliative Prophylaxis:   Frequent Pain Assessment  Psycho-social/Spiritual:   Desire for further Chaplaincy support:yes  Prognosis:    Hours - Days  Discharge Planning: Anticipated Hospital Death  Primary Diagnoses: Present on Admission: . Acute respiratory failure with hypoxia ([place]) . Multifocal pneumonia . Dementia with behavioral disturbance ([place]) . HTN (hypertension) . Chronic heart failure with preserved ejection fraction (HFpEF) ([place])   I have reviewed the medical record, interviewed the patient and family, and examined the patient. The following aspects are pertinent.  Past Medical History:  Diagnosis Date  . COPD (chronic obstructive pulmonary disease) ([place])   . Dementia ([place])   . Hypertension    Social History   Socioeconomic History  . Marital status: Married    Spouse name: Not on file  . Number of children: Not on  file  . Years of education: Not on file  . Highest education level: Not on file  Occupational History  . Not on file  Tobacco Use  . Smoking status: Not on file  Substance and Sexual Activity  . Alcohol use: Not on file  . Drug use: Not on file  . Sexual activity: Not on file  Other Topics Concern  . Not on file  Social History Narrative  . Not on file   Social Determinants of Health   Financial Resource Strain:   . Difficulty of Paying Living Expenses: Not on file  Food Insecurity:   . Worried About Community education officer in the Last Year: Not on file  . Ran Out of Food in the Last Year: Not on file  Transportation Needs:   . Lack of Transportation (Medical): Not on file  . Lack of Transportation (Non-Medical): Not on file  Physical Activity:   . Days of Exercise per Week: Not on file  . Minutes of Exercise per Session: Not on file  Stress:   . Feeling of Stress : Not on file  Social Connections:   . Frequency of Communication with Friends and Family: Not on file  . Frequency of Social Gatherings with Friends and Family: Not on file  . Attends Religious Services: Not on file  . Active Member of Clubs or Organizations: Not on file  . Attends Banker Meetings: Not on file  . Marital Status: Not on file   No family history on file. Scheduled Meds: . Chlorhexidine Gluconate Cloth  6 each Topical Daily  . LORazepam  2 mg Intravenous Q4H   Continuous Infusions: . fentaNYL 10 mcg/ml infusion     PRN Meds:.acetaminophen **OR** acetaminophen, antiseptic oral rinse, diphenhydrAMINE, glycopyrrolate **OR** glycopyrrolate **OR** glycopyrrolate, haloperidol lactate, ondansetron **OR** ondansetron (ZOFRAN) IV, polyvinyl alcohol Medications Prior to Admission:  Prior to Admission medications   Medication Sig Start Date End Date Taking? Authorizing Provider  acetaminophen (TYLENOL) 500 MG tablet Take 500 mg by mouth daily as needed.   Yes [provider]  ADVAIR DISKUS 250-50 MCG/DOSE AEPB Inhale 1 puff into the lungs 2 (two) times daily.  06/16/19  Yes [provider]  albuterol (PROVENTIL) (2.5 MG/3ML) 0.083% nebulizer solution Take 2.5 mg by nebulization every 6 (six) hours as needed for wheezing or shortness of breath.  07/26/19  Yes [provider]  azithromycin (ZITHROMAX) 250 MG tablet Take 250-500 mg by mouth See admin instructions. Starting on 08/25/2019 take 500mg  on day 1 then take 250mg  on days 2 through 5.   Yes [provider]  Cholecalciferol 25  MCG (1000 UT) capsule Take 1,000 Units by mouth daily.    Yes [provider]  clopidogrel (PLAVIX) 75 MG tablet Take 75 mg by mouth daily.    Yes [provider]  loratadine (CLARITIN) 10 MG tablet Take 10 mg by mouth daily.   Yes [provider]  NIFEdipine (PROCARDIA-XL/NIFEDICAL-XL) 30 MG 24 hr tablet Take 30 mg by mouth daily. 06/26/19  Yes [provider]  predniSONE (DELTASONE) 10 MG tablet Take 10 mg by mouth See admin instructions. Starting on 08/25/2019 on tapered course take 10mg  three times daily for 3 days, then take 10mg  twice daily for 3 days, then take 10mg  once daily for 1 day, then STOP.   Yes [provider]  rivastigmine (EXELON) 9.5 mg/24hr Place 1 patch onto the skin daily.   Yes [provider]  umeclidinium bromide (  INCRUSE ELLIPTA) 62.5 MCG/INH AEPB Inhale 1 puff into the lungs daily.    Yes [provider]   Allergies  Allergen Reactions  . Amoxil [Amoxicillin] Nausea And Vomiting  . Asa [Aspirin]     Unknown reaction  . Morphine And Related     Unknown reaction   Review of Systems  Unable to perform ROS: Acuity of condition    Physical Exam Vitals and nursing note reviewed.  Constitutional:      Comments: frail  Cardiovascular:     Rate and Rhythm: Tachycardia present.  Pulmonary:     Effort: Respiratory distress present.     Vital Signs: BP (!) 112/46   Pulse 82   Temp 97.6 F (36.4 C) (Axillary)   Resp (!) 22   Ht 5\' 2"  (1.575 m)   Wt 69.3 kg   SpO2 (!) 84%   BMI 27.94 kg/m  Pain Scale: PAINAD   Pain Score: Asleep   SpO2: SpO2: (!) 84 % O2 Device:SpO2: (!) 84 % O2 Flow Rate: .O2 Flow Rate (L/min): 15 L/min  IO: Intake/output summary:   Intake/Output Summary (Last 24 hours) at 08/20/2019 1138 Last data filed at 09/09/2019 0900 Gross per 24 hour  Intake 525 ml  Output -  Net 525 ml    LBM: Last BM Date: 08/31/19 Baseline Weight: Weight: 72.6 kg Most recent weight:  Weight: 69.3 kg     Palliative Assessment/Data: PPS: 10%     Thank you for this consult. Palliative medicine will continue to follow and assist as needed.   Time In: 1000 Time Out: 1115 Time Total: 75 minutes Greater than 50%  of this time was spent counseling and coordinating care related to the above assessment and plan.  Signed by: 09/02/19, AGNP-C Palliative Medicine    Please contact Palliative Medicine Team phone at [phone] for questions and concerns.  For individual provider: See [phone]

## 2019-09-14 NOTE — Progress Notes (Signed)
Family has inquired about Home hospice, Physician will be notified.

## 2019-09-14 NOTE — Discharge Summary (Signed)
Jill Mcpherson YQI:[id number] DOB: October 15, 1924 DOA: 09/09/19  PCP: Kirstie Peri, MD PCP/Office notified:   Admit date: 09/09/19 Date of Death: 09/14/2019 -Patient  Expired on September 14, 2019 at 1350 PM  Final Diagnoses:  Principal Problem:   Acute respiratory failure with hypoxia (HCC) Active Problems:   Multifocal pneumonia   End of life care   HTN (hypertension)   Chronic heart failure with preserved ejection fraction (HFpEF) (HCC)   Dementia with behavioral disturbance (HCC)   Palliative care encounter   Chronic bronchitis (HCC)   Community acquired pneumonia   Palliative care by specialist   Advanced care planning/counseling discussion   Goals of care, counseling/discussion   History of present illness:   HPI on Admission as documented by Admitting Physician:- Chief Complaint: SOB, cough HPI: Jill Mcpherson is a 86 y.o. female with medical history significant for COPD, dementia, hypertension.  Patient was brought to the ED by her son- Ardelle Balls who lives with her.  Patient has been having difficulty breathing and productive cough over the past week.  Patient was started on steroids 3 days ago which she has been compliant with and mostly helped her breathing until yesterday, but the steroids also made her a bit hyperactive.  She was also started on home O2 yesterday at 2 L.  Patient's son denies any confusion or change in mental status.  She has baseline dementia with memory problems that is unchanged. Patient son also noticed that her left lower extremity was more swollen than her right yesterday, but this appears to have improved today.  No redness or warmth of lower extremity. Patient denies chest pain.   ED Course: O2 sats 57% on room air.  Patient was placed on nasal cannula with sats 91% and subsequently changed to nonrebreather mask.  Focal bilateral airspace disease which could reflect pneumonia or edema.  Negative for COVID-19 and influenza.  BNP elevated at 189.  Normal lactic acid  1.5.  Venous blood gas PCO2 of 48. Patient was started on IV vancomycin and Zosyn.  Hospitalist to admit for pneumonia.  Review of Systems: As per HPI all other systems reviewed and negative.  Hospital Course:  Brief Summary: 86 y.o.femalewith medical history significant forCOPD, dementia, hypertension admitted on 2019/09/09 with acute hypoxic respiratory failure in the setting of multifocal pneumonia with some concerns about CHF as well -Respiratory status progressively worsened despite aggressive management, failed nasal cannula, failed high flow oxygen, required persistent BiPAP use -PCCM and palliative consult appreciated -Family has decided to transition to comfort care at this time  A/p 1)Multifocal Pneumonia--- treated with steroids and  Rocephin/azithromycin along with mucolytics and bronchodilators -Repeat chest x-ray on 08/31/2019 with mostly left-sided pneumonia/consolidation -Patient consult appreciated -- family request transition to comfort care  2)Acute hypoxic respiratory failure--- secondary to #1 above, echo with EF 65 to 70 % with dCHF-comfort care at this time  3)Lower Extremity Swelling--- left more than right, venous Dopplers negative for DVT, comfort care  4)HFpEF--- patient with chronic diastolic dysfunction CHF, EF 65 to 70%, with grade 1 diastolic dysfunction--comfort care  5)Social/Ethics--Plan of care and code status d/w Both sons (son Ardelle Balls at bedside and son Casimiro Needle (by phone in [place]), also discussed with patient niece and caregiver at bedside -Patient's granddaughter who is a Designer, jewellery is also at bedsise -Respiratory status progressively worsened despite aggressive management, failed nasal cannula, failed high flow oxygen, required persistent BiPAP use -PCCM consult appreciated -Family has decided to transition to comfort care at this time  Disposition---Patient  Expired  on 2019/09/24 at 1350 PM Code Status : DNR/DNI/comfort  care  Family Communication:Discussed with son at bedside Plan of care and code status d/w Both sons (son [name] at bedside and son Legrand Como (by phone in [place]), also discussed with niece/caregiver at bedside --Patient's granddaughter who is a Equities trader is also at [employer]  : PCCM/Palliative  Time:- Patient  Expired on 24-Sep-2019 at 1350 PM  Signed:  Marcel Sorter  [facility] 2019/09/24, 4:06 PM

## 2019-09-14 NOTE — Progress Notes (Signed)
This RN wasted 2500 mcg of Fentanyl wasted in stericycle bin with Demetrio Lapping, RN as witness.  Quita Skye, RN

## 2019-09-14 DEATH — deceased

## 2020-10-17 IMAGING — US US EXTREM LOW VENOUS*L*
1 series · 13 of 24 positions shown · non-contrast
Comparison: None.

CLINICAL DATA: Left leg swelling for 3 days



[Series 1: us extrem low venous*left* · 0.08mm/px · 13 of 49 slices shown]
[im 1/49]
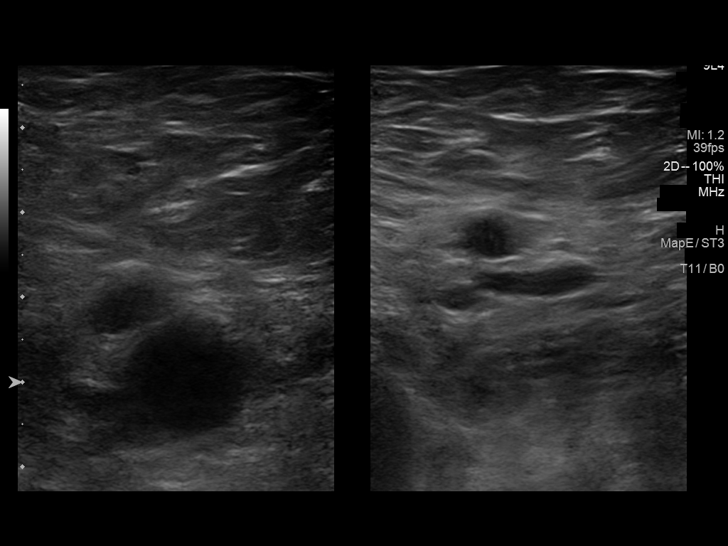
[im 5/49]
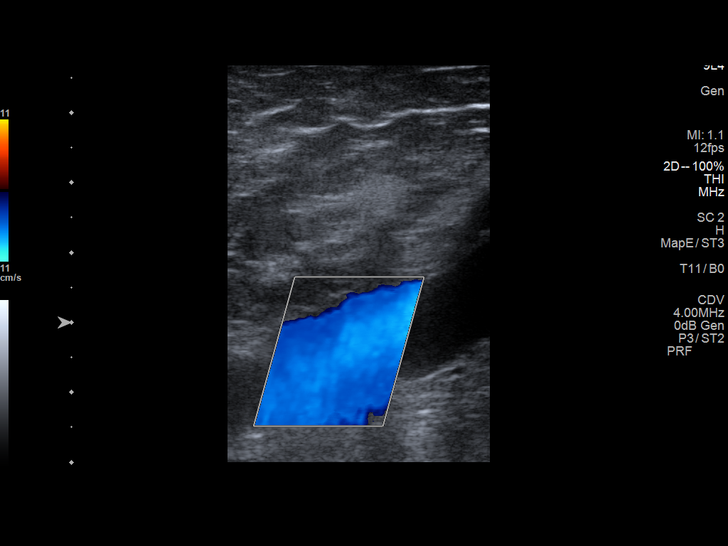
[im 9/49]
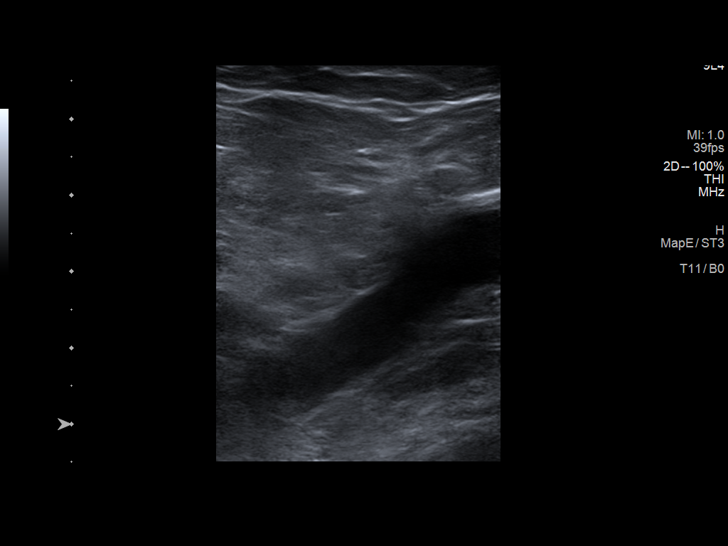
[im 13/49]
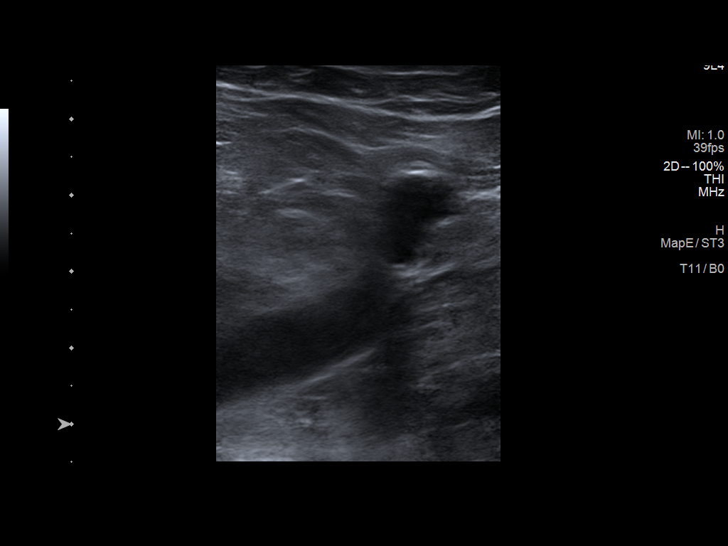
[im 17/49]
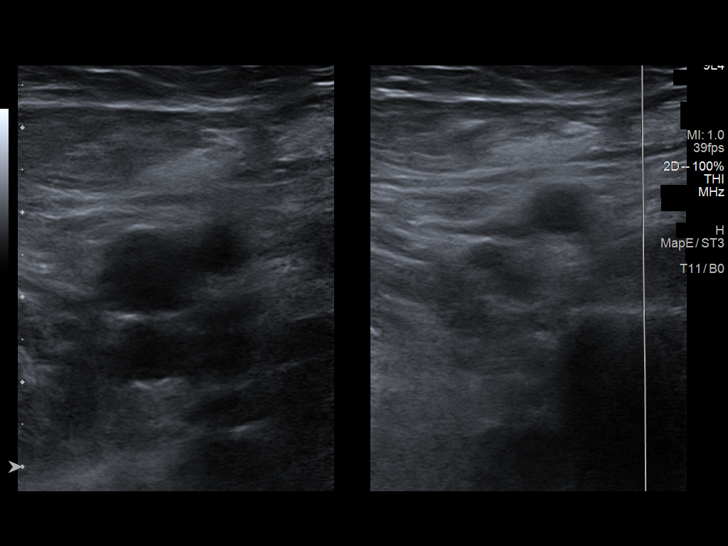
[im 21/49]
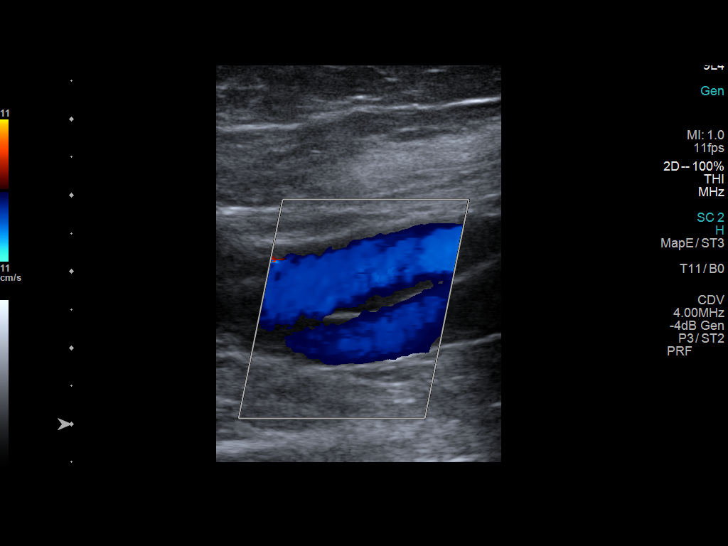
[im 26/49]
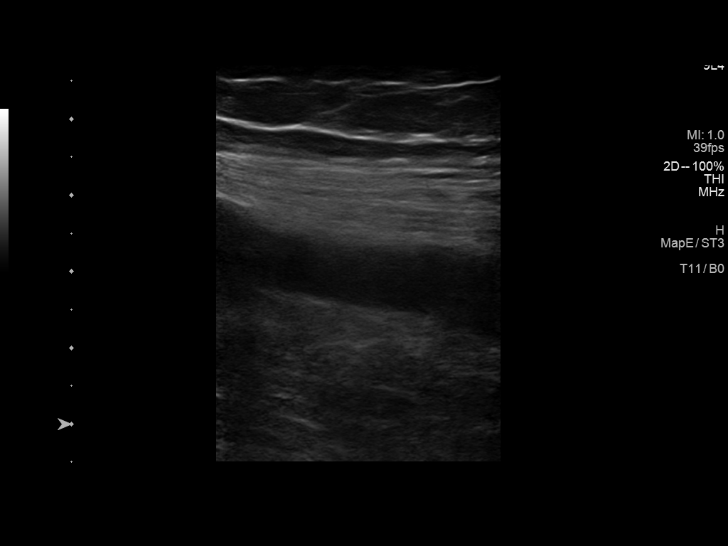
[im 28/49]
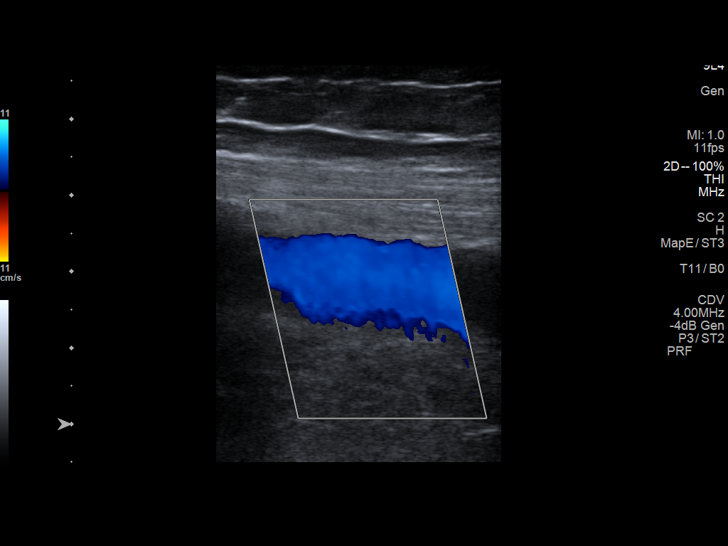
[im 32/49]
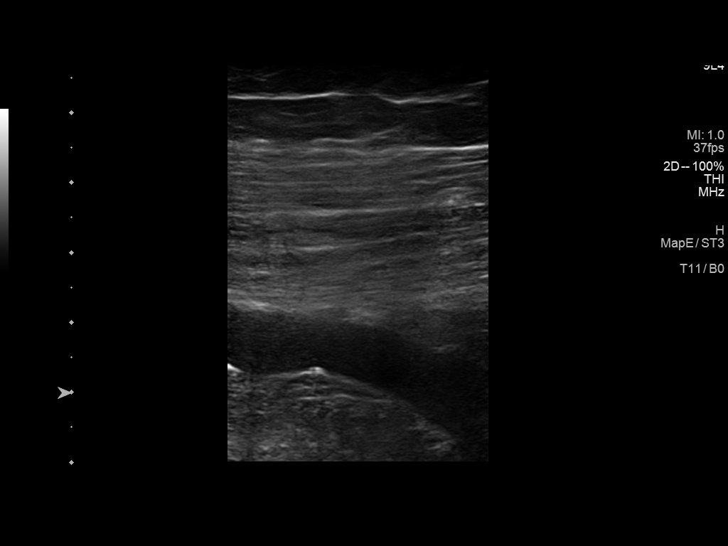
[im 36/49]
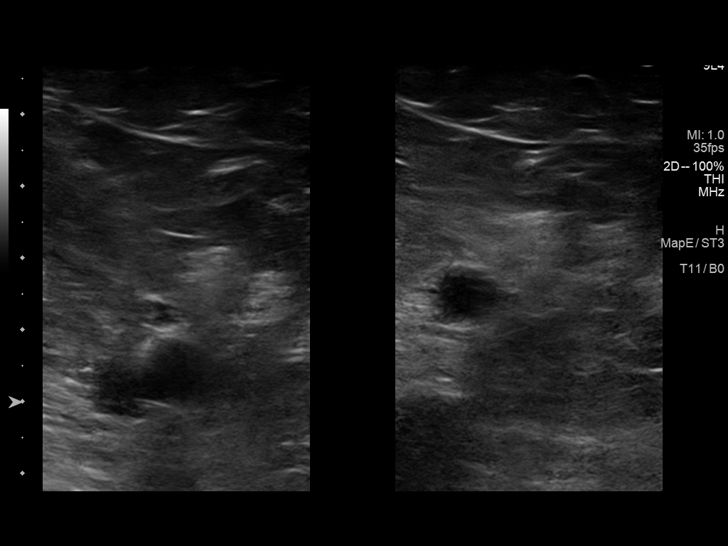
[im 40/49]
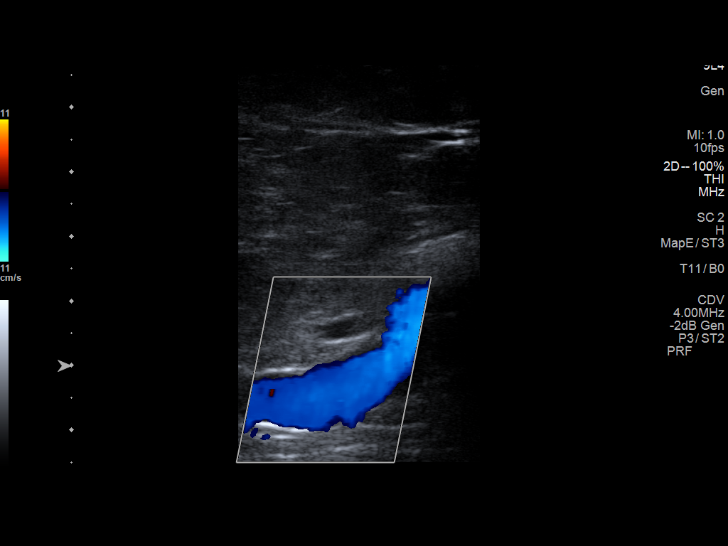
[im 44/49]
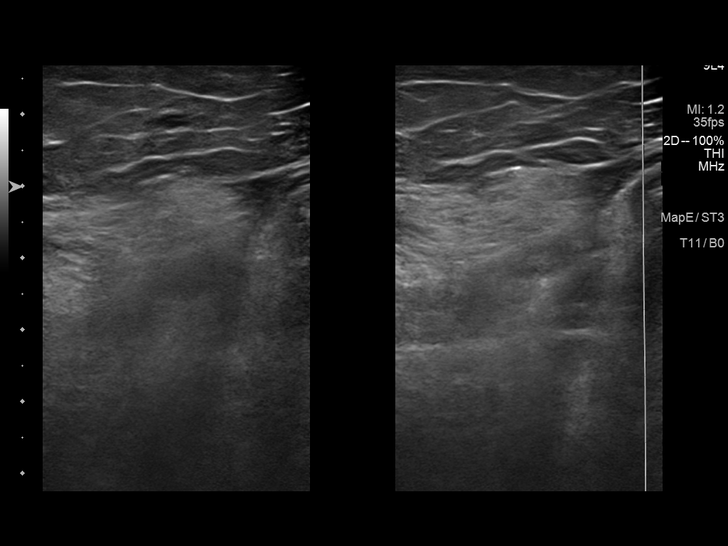
[im 49/49]
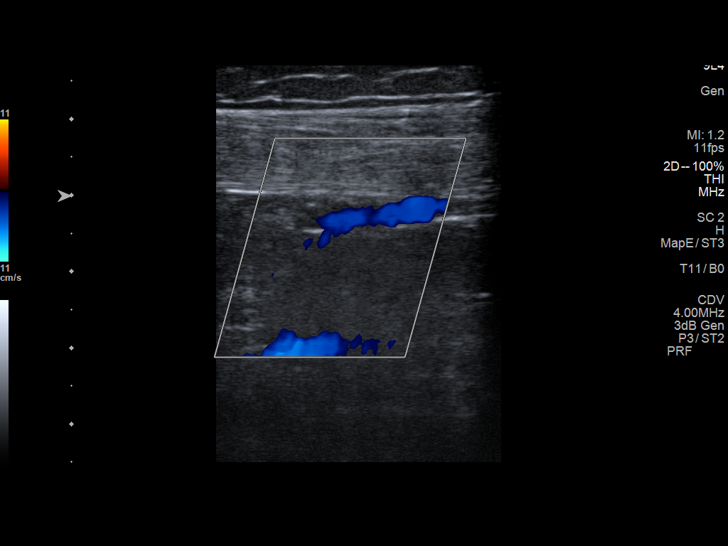

[13 of 24 positions shown; findings below may reference images not displayed]

FINDINGS: Contralateral Common Femoral Vein: Respiratory phasicity is normal
and symmetric with the symptomatic side. No evidence of thrombus.
Normal compressibility.

Common Femoral Vein: No evidence of thrombus. Normal
compressibility, respiratory phasicity and response to augmentation.

Saphenofemoral Junction: No evidence of thrombus. Normal
compressibility and flow on color Doppler imaging.

Profunda Femoral Vein: No evidence of thrombus. Normal
compressibility and flow on color Doppler imaging.

Femoral Vein: No evidence of thrombus. Normal compressibility,
respiratory phasicity and response to augmentation.

Popliteal Vein: No evidence of thrombus. Normal compressibility,
respiratory phasicity and response to augmentation.

Calf Veins: No evidence of thrombus. Normal compressibility and flow
on color Doppler imaging.

Superficial Great Saphenous Vein: No evidence of thrombus. Normal
compressibility.

Venous Reflux:  None.

Other Findings:  None.
IMPRESSION: No evidence of deep venous thrombosis.

## 2020-10-18 IMAGING — DX DG CHEST 1V PORT
1 series · 1 of 1 positions shown · non-contrast
Comparison: 08/29/2019

CLINICAL DATA: Check right-sided PICC line placement

EXAM:
PORTABLE CHEST 1 VIEW

[chest ap]
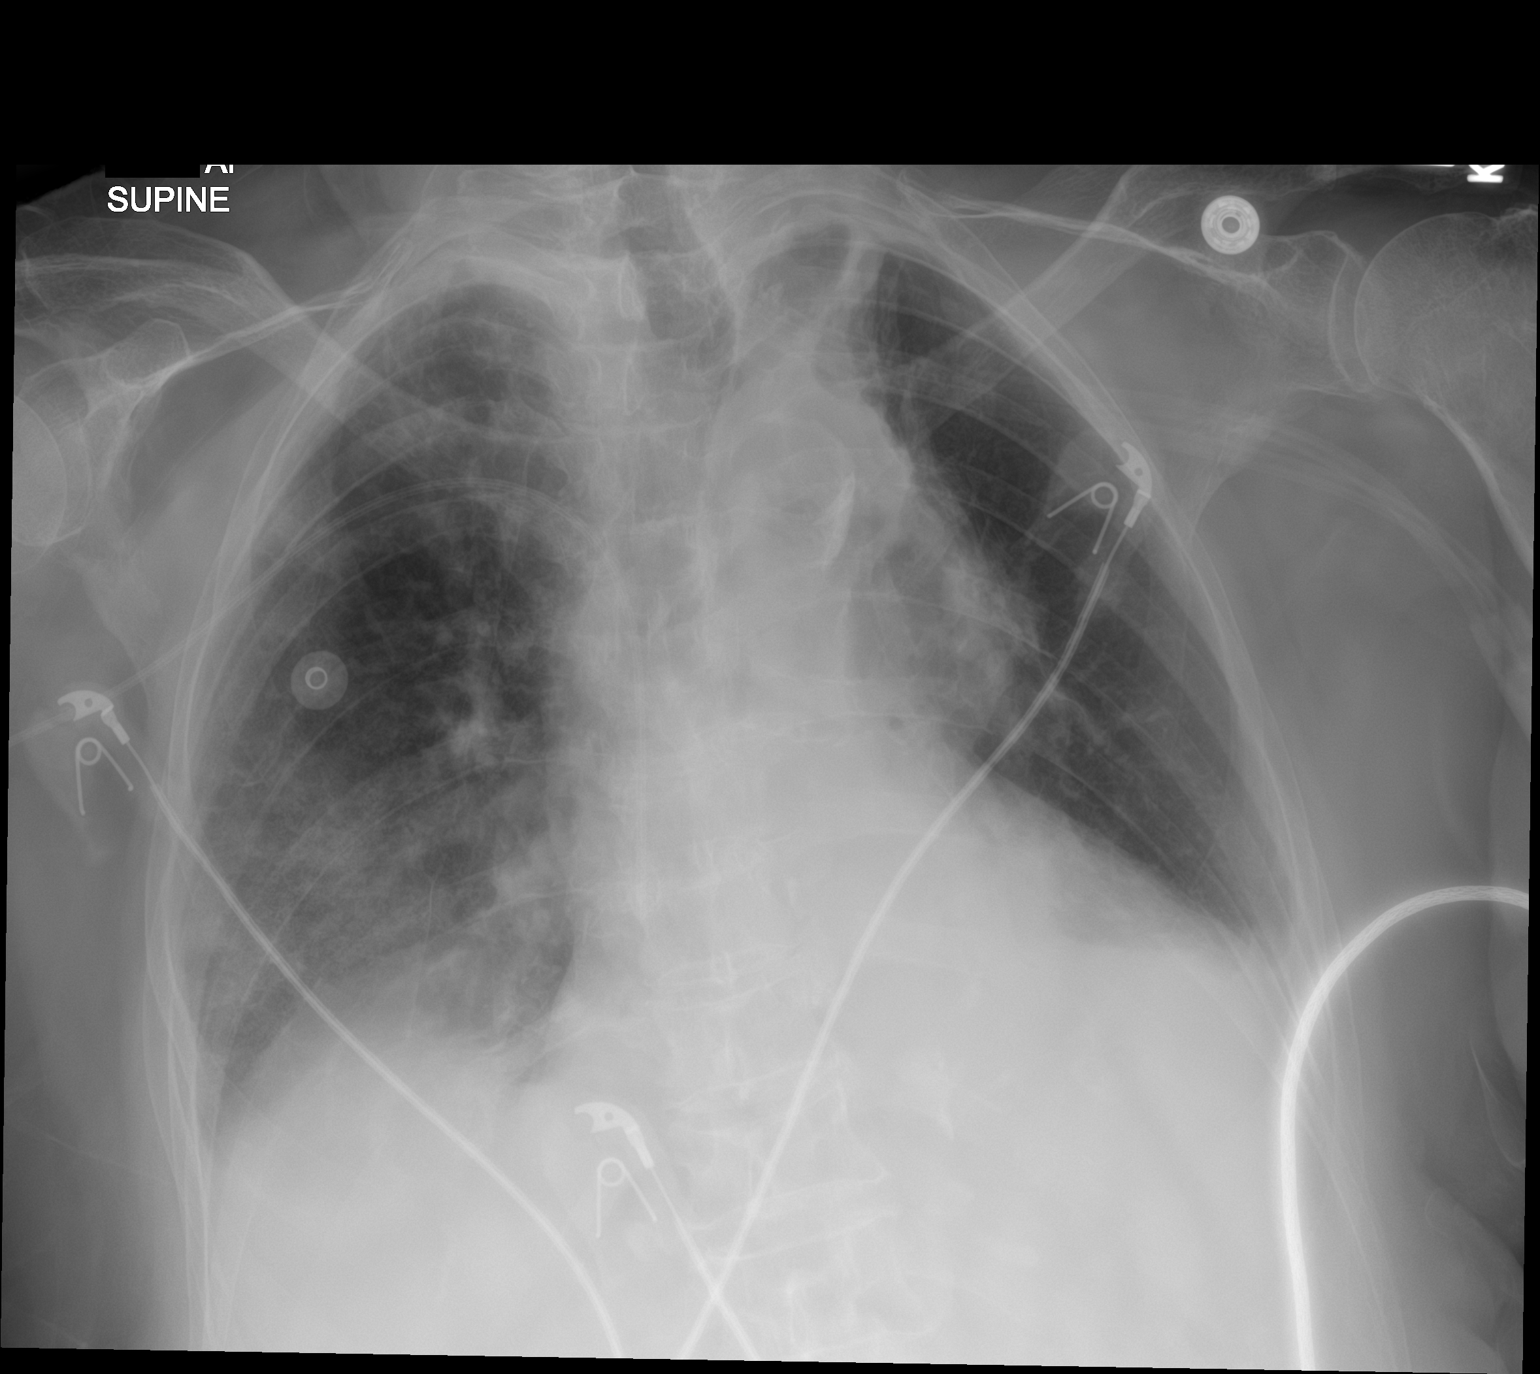

[1 of 1 positions shown; findings below may reference images not displayed]

FINDINGS: Right-sided PICC line is now noted with the catheter tip at the
cavoatrial junction in satisfactory position. Patchy opacities are
again identified and stable. Aortic calcifications are seen. Cardiac
shadow is enlarged but stable. Small effusions are noted.
IMPRESSION: Right-sided PICC line in satisfactory position.

The remainder of the exam is stable from the prior study.

## 2020-10-18 IMAGING — DX DG CHEST 1V PORT
1 series · 1 of 1 positions shown · non-contrast
Comparison: 08/27/2019

CLINICAL DATA: Hypoxia.

EXAM:
PORTABLE CHEST 1 VIEW

[chest ap]
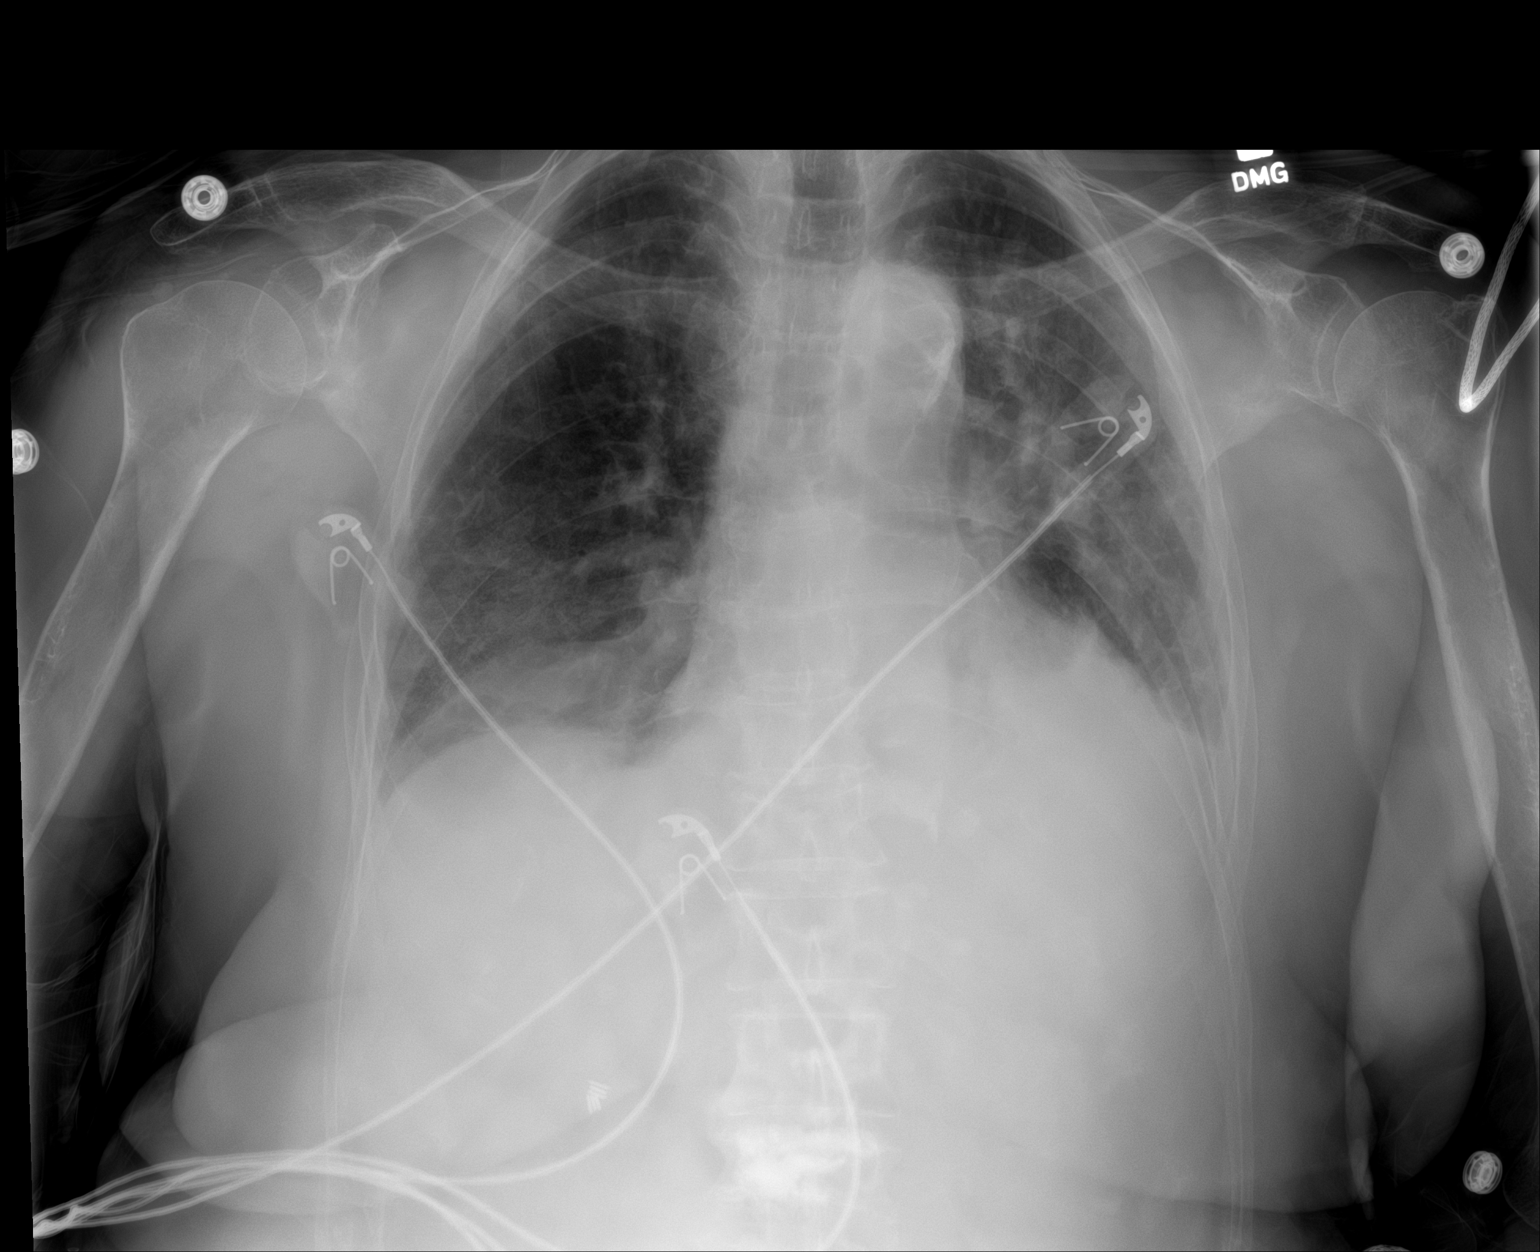

[1 of 1 positions shown; findings below may reference images not displayed]

FINDINGS: There is a moderate left pleural effusion and small right pleural
effusion. Multifocal bilateral airspace densities are noted
throughout the left lung and right base. When compared with the
previous exam aeration to the right base has diminished.
IMPRESSION: 1. Bilateral pleural effusions.
2. Multifocal airspace disease compatible with pneumonia with
worsening aeration to right base

## 2020-10-20 IMAGING — DX DG CHEST 1V
1 series · 1 of 1 positions shown · non-contrast
Comparison: Yesterday

CLINICAL DATA: Dyspnea and respiratory problems

EXAM:
CHEST  1 VIEW

[chest ap]
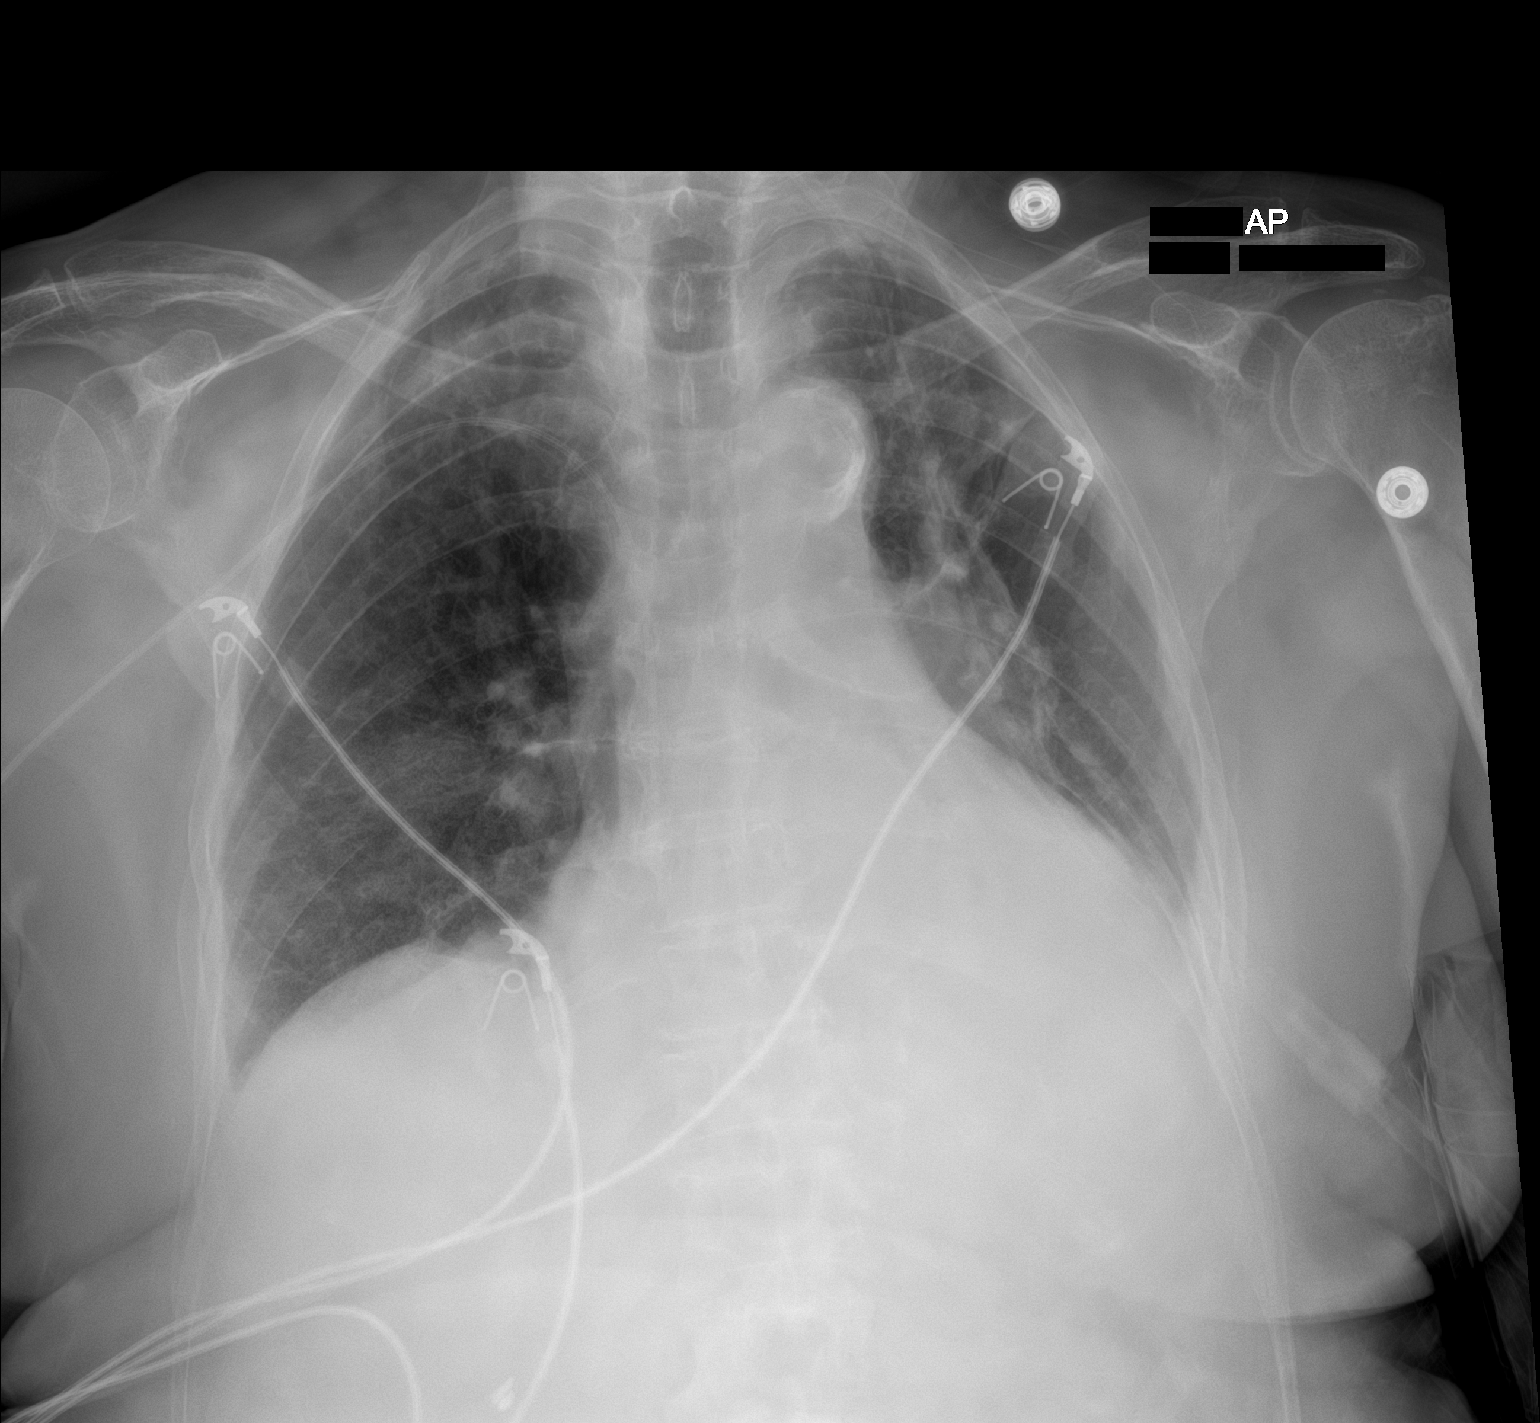

[1 of 1 positions shown; findings below may reference images not displayed]

FINDINGS: Worsening retrocardiac aeration with obscured diaphragm. Improved
right upper lobe aeration.

Stable cardiomegaly.

Unremarkable right PICC with tip at the upper cavoatrial junction.
IMPRESSION: Worsened left lower lobe and improved right upper lobe aeration.
